# Patient Record
Sex: Female | Born: 1965 | Hispanic: No | Marital: Married | Smoking: Former smoker
Health system: Southern US, Community
[De-identification: ages and names within clinical notes are randomized; demographics above are authoritative.]

## PROBLEM LIST (undated history)

## (undated) DIAGNOSIS — R002 Palpitations: Secondary | ICD-10-CM

## (undated) DIAGNOSIS — I509 Heart failure, unspecified: Secondary | ICD-10-CM

## (undated) DIAGNOSIS — R569 Unspecified convulsions: Secondary | ICD-10-CM

## (undated) DIAGNOSIS — I10 Essential (primary) hypertension: Secondary | ICD-10-CM

## (undated) DIAGNOSIS — R079 Chest pain, unspecified: Secondary | ICD-10-CM

## (undated) DIAGNOSIS — R55 Syncope and collapse: Secondary | ICD-10-CM

## (undated) DIAGNOSIS — E669 Obesity, unspecified: Secondary | ICD-10-CM

## (undated) DIAGNOSIS — M7989 Other specified soft tissue disorders: Secondary | ICD-10-CM

## (undated) HISTORY — DX: Unspecified convulsions: R56.9

## (undated) HISTORY — PX: COLONOSCOPY: SHX174

## (undated) HISTORY — DX: Other specified soft tissue disorders: M79.89

## (undated) HISTORY — DX: Chest pain, unspecified: R07.9

## (undated) HISTORY — DX: Obesity, unspecified: E66.9

## (undated) HISTORY — DX: Palpitations: R00.2

## (undated) HISTORY — DX: Syncope and collapse: R55

---

## 2001-07-08 ENCOUNTER — Encounter: Admission: RE | Admit: 2001-07-08 | Discharge: 2001-07-08 | Payer: Self-pay

## 2011-01-07 HISTORY — PX: APPENDECTOMY: SHX54

## 2013-10-19 ENCOUNTER — Other Ambulatory Visit: Payer: Self-pay | Admitting: Nurse Practitioner

## 2013-10-23 NOTE — Telephone Encounter (Signed)
I called patient at her home and work numbers. Her home number is D/C'd. She does not work at the location associated with her work number. I will send her a letter at address on file.

## 2013-11-06 ENCOUNTER — Telehealth: Payer: Self-pay | Admitting: Nurse Practitioner

## 2013-11-06 NOTE — Telephone Encounter (Signed)
LMVM for pt on work # that appt made for her on Friday 11-10-13 (first available) at 0900 be here 0845.

## 2013-11-07 ENCOUNTER — Encounter: Payer: Self-pay | Admitting: Nurse Practitioner

## 2013-11-10 ENCOUNTER — Ambulatory Visit (INDEPENDENT_AMBULATORY_CARE_PROVIDER_SITE_OTHER): Payer: BC Managed Care – PPO | Admitting: Nurse Practitioner

## 2013-11-10 ENCOUNTER — Encounter (INDEPENDENT_AMBULATORY_CARE_PROVIDER_SITE_OTHER): Payer: Self-pay

## 2013-11-10 ENCOUNTER — Encounter: Payer: Self-pay | Admitting: Nurse Practitioner

## 2013-11-10 VITALS — BP 127/82 | HR 81 | Ht 66.0 in | Wt 246.0 lb

## 2013-11-10 DIAGNOSIS — Z79899 Other long term (current) drug therapy: Secondary | ICD-10-CM

## 2013-11-10 DIAGNOSIS — R569 Unspecified convulsions: Secondary | ICD-10-CM | POA: Insufficient documentation

## 2013-11-10 NOTE — Patient Instructions (Signed)
Continue Keppra at current dose Will get labs today Will schedule EEG F/U in 3 months

## 2013-11-10 NOTE — Progress Notes (Signed)
I agree with the assessment and plan as directed by NP .The patient is known to me .   Alithia Zavaleta, MD  

## 2013-11-10 NOTE — Progress Notes (Signed)
[facility]  PATIENT: Julie Hampton DOB: 02-16-1966   REASON FOR VISIT: followup for seizure disorder     HISTORY OF PRESENT ILLNESS: Julie Hampton, 47 year old right handed female returns for followup. She has a history of seizure disorder for greater than 30 years was last seen in our office 07/08/2012. At that time she was on Keppra 500 (4) tabs daily with no seizure activity in several years. She has been on Lyrica,  Carbamazepine, and Topamax in the past with side effects. She reports today on her followup visit she had several episodes in the past month where she had dizziness, tingling in the face and hands on both sides, confusion and inability to focus  and palpitations. She claims that she had a stress test back in the summer at [facility] which was normal. She says her seizures have started like this in the past. She has not had recent labs.   REVIEW OF SYSTEMS: Full 14 system review of systems performed and notable only for: those listed all others negative Constitutional: N/A  Cardiovascular: N/A  Ear/Nose/Throat: N/A  Skin: N/A  Eyes: N/A  Respiratory: N/A  Gastroitestinal: N/A  Hematology/Lymphatic: N/A  Endocrine: N/A Musculoskeletal:N/A  Allergy/Immunology: N/A  Neurological: Numbness, dizziness  Psychiatric: N/A   ALLERGIES: Allergies  Allergen Reactions  . Penicillins     HOME MEDICATIONS: Outpatient Prescriptions Prior to Visit  Medication Sig Dispense Refill  . levETIRAcetam (KEPPRA XR) 500 MG 24 hr tablet TAKE 4 TABLETS BY MOUTH EVERY MORNING.  120 tablet  1   No facility-administered medications prior to visit.    PAST MEDICAL HISTORY: Past Medical History  Diagnosis Date  . Seizures     PAST SURGICAL HISTORY: Past Surgical History  Procedure Laterality Date  . Appendectomy  01/2011  . Colonoscopy      removed pre-cancerous polyps    FAMILY HISTORY: Family History  Problem Relation Age of Onset  . Colon cancer Mother    . Heart failure Father   . Breast cancer      Aunt  . Epilepsy Sister   . Skin cancer Sister     SOCIAL HISTORY: History   Social History  . Marital Status: Married    Spouse Name: N/A    Number of Children: N/A  . Years of Education: N/A   Occupational History  . Not on file.   Social History Main Topics  . Smoking status: Former Games developer  . Smokeless tobacco: Never Used     Comment: Quit- 1995  . Alcohol Use: No     Comment: Quit in 1989  . Drug Use: No  . Sexual Activity: Not on file   Other Topics Concern  . Not on file   Social History Narrative   Patient is married and lives with her husband and son.   Patient is a Runner, broadcasting/film/video in [place].   Patient is right-handed.   Patient drinks one cup of coffee daily.     PHYSICAL EXAM  Filed Vitals:   11/10/13 0859  BP: 127/82  Pulse: 81  Height: 5\' 6"  (1.676 m)  Weight: 246 lb (111.585 kg)   Body mass index is 39.72 kg/(m^2).  Generalized: Well developed, obese female in no acute distress  Head: normocephalic and atraumatic,. Oropharynx benign  Neck: Supple, no carotid bruits  Cardiac: Regular rate rhythm, no murmur  Musculoskeletal: Brace to  right hand, fractured  wrist Neurological examination   Mentation: Alert oriented to time, place, history taking.  Follows all commands speech and language fluent  Cranial nerve II-XII: Fundoscopic exam reveals sharp disc margins.Pupils were equal round reactive to light extraocular movements were full, visual field were full on confrontational test. Facial sensation and strength were normal. hearing was intact to finger rubbing bilaterally. Uvula tongue midline. head turning and shoulder shrug were normal and symmetric.Tongue protrusion into cheek strength was normal. Motor: normal bulk and tone, full strength in the BUE, BLE, fine finger movements normal, no pronator drift. No focal weakness Sensory: normal and symmetric to light touch, pinprick, and  vibration    Coordination: finger-nose-finger, heel-to-shin bilaterally, no dysmetria Reflexes: 1+ upper lower and symmetric Gait and Station: Rising up from seated position without assistance, normal stance,  moderate stride, good arm swing, smooth turning, able to perform tiptoe, and heel walking without difficulty. Tandem gait is steady  DIAGNOSTIC DATA (LABS, IMAGING, TESTING) - None to review    ASSESSMENT AND PLAN  47 y.o. year old female  has a past medical history of Seizures. here to followup. She has had 3 episodes recently where she had dizziness, tingling in the face , numbness of both hands, confusion and palpitations which she says is like her previous seizure activity. She denies missing any doses of her Keppra  Continue Keppra at current dose Will get labs today, CBC, CMP and keppra level Will schedule EEG F/U in 3 months Nilda Riggs, [facility], [facility], APRN  [facility] [address], [address] [place], [place] [address] [phone]

## 2013-11-12 ENCOUNTER — Other Ambulatory Visit: Payer: Self-pay

## 2013-11-12 MED ORDER — LEVETIRACETAM ER 500 MG PO TB24: 2000.0000 mg | ORAL_TABLET | ORAL | Status: DC

## 2013-11-13 LAB — CBC WITH DIFFERENTIAL/PLATELET
Basophils Absolute: 0 10*3/uL (ref 0.0–0.2)
Basos: 1 %
Eos: 4 %
Eosinophils Absolute: 0.2 10*3/uL (ref 0.0–0.4)
HCT: 40.2 % (ref 34.0–46.6)
Hemoglobin: 13.8 g/dL (ref 11.1–15.9)
Immature Grans (Abs): 0 10*3/uL (ref 0.0–0.1)
Immature Granulocytes: 0 %
Lymphocytes Absolute: 1.8 10*3/uL (ref 0.7–3.1)
Lymphs: 28 %
MCH: 31.4 pg (ref 26.6–33.0)
MCHC: 34.3 g/dL (ref 31.5–35.7)
MCV: 92 fL (ref 79–97)
Monocytes Absolute: 0.5 10*3/uL (ref 0.1–0.9)
Monocytes: 8 %
Neutrophils Absolute: 3.8 10*3/uL (ref 1.4–7.0)
Neutrophils Relative %: 59 %
RBC: 4.39 x10E6/uL (ref 3.77–5.28)
RDW: 13.1 % (ref 12.3–15.4)
WBC: 6.4 10*3/uL (ref 3.4–10.8)

## 2013-11-13 LAB — COMPREHENSIVE METABOLIC PANEL
ALT: 14 IU/L (ref 0–32)
AST: 14 IU/L (ref 0–40)
Albumin/Globulin Ratio: 2 (ref 1.1–2.5)
Albumin: 4.5 g/dL (ref 3.5–5.5)
Alkaline Phosphatase: 60 IU/L (ref 39–117)
BUN/Creatinine Ratio: 14 (ref 9–23)
BUN: 11 mg/dL (ref 6–24)
CO2: 22 mmol/L (ref 18–29)
Calcium: 10 mg/dL (ref 8.7–10.2)
Chloride: 101 mmol/L (ref 97–108)
Creatinine, Ser: 0.78 mg/dL (ref 0.57–1.00)
GFR calc Af Amer: 106 mL/min/{1.73_m2} (ref >59)
GFR calc non Af Amer: 92 mL/min/{1.73_m2} (ref >59)
Globulin, Total: 2.2 g/dL (ref 1.5–4.5)
Glucose: 120 mg/dL — ABNORMAL HIGH (ref 65–99)
Potassium: 4.1 mmol/L (ref 3.5–5.2)
Sodium: 143 mmol/L (ref 134–144)
Total Bilirubin: 0.3 mg/dL (ref 0.0–1.2)
Total Protein: 6.7 g/dL (ref 6.0–8.5)

## 2013-11-13 LAB — LEVETIRACETAM LEVEL: Levetiracetam Lvl: 27.3 ug/mL (ref 5.0–63.0)

## 2013-11-14 ENCOUNTER — Other Ambulatory Visit: Payer: Self-pay | Admitting: Nurse Practitioner

## 2013-11-14 MED ORDER — LEVETIRACETAM ER 500 MG PO TB24: ORAL_TABLET | ORAL | Status: DC

## 2013-11-15 ENCOUNTER — Other Ambulatory Visit (INDEPENDENT_AMBULATORY_CARE_PROVIDER_SITE_OTHER): Payer: BC Managed Care – PPO | Admitting: Radiology

## 2013-11-15 DIAGNOSIS — R569 Unspecified convulsions: Secondary | ICD-10-CM

## 2013-11-15 NOTE — Progress Notes (Signed)
Quick Note:  Left message on a number that was in centricity [phone] because when I called the number in EPIC it was busy as it was for [place] 2 times previously. I asked her to increase her Keppra by 500mg  daily and also to return my call to be sure I have the correct number. ______

## 2013-11-28 ENCOUNTER — Telehealth: Payer: Self-pay | Admitting: *Deleted

## 2013-11-28 NOTE — Telephone Encounter (Signed)
This Rx was already sent in on 12/09.  I called the pharmacy, they said they do have the Rx, but the patient never picked it up, so it was returned to stock.  I called the patient several times before I was able to reach her.  Finally spoke with the patient and advised Rx was already sent.  She will follow up with the pharmacy.

## 2013-12-13 ENCOUNTER — Telehealth: Payer: Self-pay | Admitting: Nurse Practitioner

## 2013-12-13 NOTE — Progress Notes (Signed)
[facility]  EEG (ELECTROENCEPHALOGRAM) REPORT   STUDY DATE:  11-25-13  PATIENT NAME: Julie Hampton, Julie Hampton  DOB: 1966/01/04  MRN:   ORDERING CLINICIAN: Burnell BlanksMaura Hamrick, MD  / Melvyn Novasarmen Lesbia Ottaway, MD   TECHNOLOGIST: Kaylyn LimFox, Sue  TECHNIQUE: Electroencephalogram was recorded utilizing standard 10-20 system of lead placement and reformatted into average and bipolar montages.   RECORDING TIME: 30.5 minutes  ACTIVATION:  HV and strobe lights.   CLINICAL INFORMATION:  FINDINGS:   This EEG documented a 9 hertz posterior dominant rhythm, a symmetric , well organized.  No epileptiform activity was seen, focality, nor EEG slowing nor spike/ wave discharges.  Sleep and wake activity was seen, reflected in normal physiologic responses in this  EEG .  There was photic entrainment at 5,7,9 hertz strobe light stimulation and amplitude built up with HV maneuver,  none resulted in epileptiform activity.  The EKG documented  normal sinus rhythm , 66 bpm.      IMPRESSION: This EEG is normal.   Melvyn Novasarmen Allean Montfort , MD  Cc Darrol Angelarolyn Martin , NP

## 2013-12-15 NOTE — Telephone Encounter (Signed)
error 

## 2014-02-08 ENCOUNTER — Ambulatory Visit: Payer: BC Managed Care – PPO | Admitting: Nurse Practitioner

## 2014-02-08 ENCOUNTER — Telehealth: Payer: Self-pay | Admitting: Nurse Practitioner

## 2014-02-08 NOTE — Telephone Encounter (Signed)
No show for scheduled appt 

## 2014-04-15 ENCOUNTER — Other Ambulatory Visit: Payer: Self-pay | Admitting: Nurse Practitioner

## 2014-04-16 NOTE — Telephone Encounter (Signed)
No showed last appt  

## 2014-04-23 ENCOUNTER — Telehealth: Payer: Self-pay | Admitting: Nurse Practitioner

## 2014-04-23 MED ORDER — LEVETIRACETAM ER 500 MG PO TB24: ORAL_TABLET | ORAL | Status: DC

## 2014-04-23 NOTE — Telephone Encounter (Signed)
Rx has been sent to last until appt in Sept.  Called patient back, got no answer.  Pharmacy to call pt when Rx is ready for pick up.

## 2014-04-23 NOTE — Telephone Encounter (Signed)
Patient requesting refill of levETIRAcetam (KEPPRA XR) 500 MG 24 hr tablet.  Pt takes 5 tabs a day.  Please call and advise.

## 2014-09-05 ENCOUNTER — Telehealth: Payer: Self-pay | Admitting: Nurse Practitioner

## 2014-09-05 ENCOUNTER — Ambulatory Visit: Payer: BC Managed Care – PPO | Admitting: Nurse Practitioner

## 2014-09-05 NOTE — Telephone Encounter (Signed)
No show for scheduled appt 

## 2014-10-01 ENCOUNTER — Other Ambulatory Visit: Payer: Self-pay | Admitting: Neurology

## 2014-10-04 ENCOUNTER — Telehealth: Payer: Self-pay | Admitting: Neurology

## 2014-10-08 ENCOUNTER — Other Ambulatory Visit: Payer: Self-pay

## 2014-10-08 MED ORDER — LEVETIRACETAM ER 500 MG PO TB24: ORAL_TABLET | ORAL | Status: DC

## 2014-10-08 NOTE — Telephone Encounter (Signed)
Patient called to make an appointment, scheduled for 10/22/14 with Dr. Vickey [facility]ohmeier [facility] to [facility].

## 2014-10-08 NOTE — Telephone Encounter (Signed)
Patient has appt scheduled

## 2014-10-22 ENCOUNTER — Encounter: Payer: Self-pay | Admitting: Neurology

## 2014-10-22 ENCOUNTER — Telehealth: Payer: Self-pay | Admitting: Radiology

## 2014-10-22 ENCOUNTER — Ambulatory Visit (INDEPENDENT_AMBULATORY_CARE_PROVIDER_SITE_OTHER): Payer: BC Managed Care – PPO | Admitting: Neurology

## 2014-10-22 VITALS — BP 135/92 | HR 76 | Temp 98.1°F | Resp 14 | Ht 66.0 in | Wt 229.0 lb

## 2014-10-22 DIAGNOSIS — G40909 Epilepsy, unspecified, not intractable, without status epilepticus: Secondary | ICD-10-CM

## 2014-10-22 DIAGNOSIS — Z5181 Encounter for therapeutic drug level monitoring: Secondary | ICD-10-CM

## 2014-10-22 MED ORDER — LEVETIRACETAM ER 500 MG PO TB24
ORAL_TABLET | ORAL | Status: DC
Start: 2014-10-22 — End: 2015-03-03

## 2014-10-22 NOTE — Patient Instructions (Signed)
Epilepsy °People with epilepsy have times when they shake and jerk uncontrollably (seizures). This happens when there is a sudden change in brain function. Epilepsy may have many possible causes. Anything that disturbs the normal pattern of brain cell activity can lead to seizures. °HOME CARE  °· Follow your doctor's instructions about driving and safety during normal activities. °· Get enough sleep. °· Only take medicine as told by your doctor. °· Avoid things that you know can cause you to have seizures (triggers). °· Write down when your seizures happen and what you remember about each seizure. Write down anything you think may have caused the seizure to happen. °· Tell the people you live and work with that you have seizures. Make sure they know how to help you. They should: °¨ Cushion your head and body. °¨ Turn you on your side. °¨ Not restrain you. °¨ Not place anything inside your mouth. °¨ Call for local emergency medical help if there is any question about what has happened. °· Keep all follow-up visits with your doctor. This is very important. °GET HELP IF: °· You get an infection or start to feel sick. You may have more seizures when you are sick. °· You are having seizures more often. °· Your seizure pattern is changing. °GET HELP RIGHT AWAY IF:  °· A seizure does not stop after a few seconds or minutes. °· A seizure causes you to have trouble breathing. °· A seizure gives you a very bad headache. °· A seizure makes you unable to speak or use a part of your body. °Document Released: 09/20/2009 Document Revised: 09/13/2013 Document Reviewed: 07/05/2013 °ExitCare® Patient Information ©2015 [employer]. This information is not intended to replace advice given to you by your health care provider. Make sure you discuss any questions you have with your health care provider. ° °

## 2014-10-22 NOTE — Progress Notes (Signed)
[facility]  PATIENT: Julie Hampton DOB: 12-13-1965   REASON FOR VISIT: followup for seizure disorder     HISTORY OF PRESENT ILLNESS: Ms. Julie Hampton, 48 year old right handed, married female returns for followup.  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  She has a history of seizure disorder for greater than 30 years, changed to [facility] in 2006 and was seen by Dr Nash Shearerhampey.  She was treated on multiple medications, polypharmacy and I [facility], she returned on only one drug, KEPPRA, and has been controlled ever since.   Was last seen in our office 11/09/2013 with Darrol Angelarolyn Martin, NP .  At that time she was on Keppra 500 (4) tabs daily with no seizure activity in several years. She has been on Lyrica,  Carbamazepine, and Topamax in the past with side effects.   REVIEW OF SYSTEMS: Full 14 system review of systems performed and notable only for: those listed all others negative Constitutional: N/A  Cardiovascular: N/A  Ear/Nose/Throat: N/A  Skin: N/A  Eyes: N/A  Respiratory: N/A  Gastroitestinal: N/A  Hematology/Lymphatic: N/A  Endocrine: N/A Musculoskeletal:N/A  Allergy/Immunology: N/A  Neurological: Numbness, dizziness  Psychiatric: N/A   ALLERGIES: Allergies  Allergen Reactions  . Morphine And Related   . Other     Banana peppers  . Penicillins   . Thallium Hives    Contrast dye for cardiac stress test.  Had increased heart rate, hives.    HOME MEDICATIONS: Outpatient Prescriptions Prior to Visit  Medication Sig Dispense Refill  . levETIRAcetam (KEPPRA XR) 500 MG 24 hr tablet TAKE 2 TABS BY MOUTH IN THE MORNING AND 3 TABS IN THE EVENING (12 HOURS APART) 150 tablet 0   No facility-administered medications prior to visit.    PAST MEDICAL HISTORY: Past Medical History  Diagnosis Date  . Seizures     PAST SURGICAL HISTORY: Past Surgical History  Procedure Laterality Date  . Appendectomy   01/2011  . Colonoscopy      removed pre-cancerous polyps    FAMILY HISTORY: Family History  Problem Relation Age of Onset  . Colon cancer Mother   . Heart failure Father   . Breast cancer      Aunt  . Epilepsy Sister   . Skin cancer Sister     SOCIAL HISTORY: History   Social History  . Marital Status: Married    Spouse Name: N/A    Number of Children: N/A  . Years of Education: N/A   Occupational History  . Not on file.   Social History Main Topics  . Smoking status: Former Games developermoker  . Smokeless tobacco: Never Used     Comment: Quit- 1995  . Alcohol Use: No     Comment: Quit in 1989  . Drug Use: No  . Sexual Activity: Not on file   Other Topics Concern  . Not on file   Social History Narrative   Patient is married and lives with her husband and son.   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   Patient drinks one cup of coffee daily.     PHYSICAL EXAM  Filed Vitals:   10/22/14 1515  BP: 135/92  Pulse: 76  Temp: 98.1 F (36.7 C)  TempSrc: Oral  Resp: 14  Height: 5\' 6"  (1.676 m)  Weight: 229 lb (103.874 kg)   Body mass index is 36.98 kg/(m^2).  Generalized: Well developed, obese female in no acute distress  Head: normocephalic and atraumatic,. Oropharynx benign  Neck: Supple, no carotid bruits  Cardiac: Regular rate rhythm, no murmur  Musculoskeletal: Brace to right hand, fractured wrist in 2014 - still in a cast after a torn ligament in October . Neurological examination   Mentation: Alert oriented to time, place, history taking. Follows all commands speech and language fluent, is cooperative.   Cranial nerve: Fundoscopic exam reveals sharp disc margins.Pupils were equal round reactive to light , fundus is normal, extraocular movements were full, visual field were full on confrontational test.  Facial sensation and strength were normal. hearing was intact to finger rubbing bilaterally.  Uvula tongue midline. head turning  and shoulder shrug were normal and symmetric.Tongue protrusion into cheek strength was normal. Motor: normal bulk and tone, full strength - fine finger movements normal, no pronator drift.  No focal weakness Sensory: normal and symmetric to light touch, pinprick, and  vibration  Coordination: finger-nose-finger- no dysmetria Reflexes: 1+ upper lower and symmetric.  Gait and Station: Rising up from seated position without assistance, normal stance,  moderate stride, good arm swing,  smooth turning, able to perform tiptoe, and heel walking without difficulty.   DIAGNOSTIC DATA (LABS, IMAGING, TESTING) 12- 04 -2015 ,  Normal CBC and diff. Keppra level and metabolic panel. Reviewed with the patient.    ASSESSMENT AND PLAN  48 y.o. year old female  has a past medical history of Seizures. here to followup.  She has had 1 episode, 3 weeks ago in late October ,  where she had dizziness, tingling in the face , numbness of both hands, confusion and palpitations , which she says is like her previous seizure activity.  She denies missing any doses of her Keppra.  She is doing well. Refills signed and lab work repeated.  Melvyn Novasarmen Erin Obando, MD  [facility] [address], [address] [place], [place] [phone]

## 2014-10-23 LAB — COMPREHENSIVE METABOLIC PANEL
ALBUMIN: 4.8 g/dL (ref 3.5–5.5)
ALT: 18 IU/L (ref 0–32)
AST: 21 IU/L (ref 0–40)
Albumin/Globulin Ratio: 2.5 (ref 1.1–2.5)
Alkaline Phosphatase: 56 IU/L (ref 39–117)
BILIRUBIN TOTAL: 0.3 mg/dL (ref 0.0–1.2)
BUN / CREAT RATIO: 16 (ref 9–23)
BUN: 13 mg/dL (ref 6–24)
CO2: 23 mmol/L (ref 18–29)
CREATININE: 0.81 mg/dL (ref 0.57–1.00)
Calcium: 10.1 mg/dL (ref 8.7–10.2)
Chloride: 99 mmol/L (ref 97–108)
GFR calc non Af Amer: 87 mL/min/{1.73_m2} (ref >59)
GFR, EST AFRICAN AMERICAN: 101 mL/min/{1.73_m2} (ref >59)
GLOBULIN, TOTAL: 1.9 g/dL (ref 1.5–4.5)
Glucose: 80 mg/dL (ref 65–99)
Potassium: 4.8 mmol/L (ref 3.5–5.2)
Sodium: 140 mmol/L (ref 134–144)
Total Protein: 6.7 g/dL (ref 6.0–8.5)

## 2014-11-04 ENCOUNTER — Other Ambulatory Visit: Payer: Self-pay | Admitting: Neurology

## 2014-11-06 ENCOUNTER — Encounter: Payer: Self-pay | Admitting: *Deleted

## 2015-03-03 ENCOUNTER — Other Ambulatory Visit: Payer: Self-pay | Admitting: Neurology

## 2015-09-30 ENCOUNTER — Other Ambulatory Visit: Payer: Self-pay | Admitting: Neurology

## 2015-10-23 ENCOUNTER — Ambulatory Visit: Payer: BC Managed Care – PPO | Admitting: Nurse Practitioner

## 2015-10-24 NOTE — Telephone Encounter (Signed)
This encounter was created in error - please disregard.

## 2015-10-28 ENCOUNTER — Other Ambulatory Visit: Payer: Self-pay | Admitting: Neurology

## 2015-10-29 ENCOUNTER — Telehealth: Payer: Self-pay

## 2015-10-29 ENCOUNTER — Other Ambulatory Visit: Payer: Self-pay | Admitting: Neurology

## 2015-10-29 MED ORDER — LEVETIRACETAM ER 500 MG PO TB24: ORAL_TABLET | ORAL | Status: DC

## 2015-10-29 NOTE — Telephone Encounter (Signed)
I was advised that pt must have 3 no shows in a years time to be dismissed. She had 2 in 2015 and 1 in 2016 thus far.

## 2015-10-29 NOTE — Telephone Encounter (Signed)
No refill if no revisit with in 12 months. After patient had 3 no-shows she will be dismissed, I will give her 30 days of medication and she can find a new neurologist/  [employer]en Philbert Hampton.

## 2015-10-29 NOTE — Telephone Encounter (Signed)
Pharmacy is requesting refill on Keppra.  It appears the patient no showed appt 10/23/15 (has not rescheduled), and had previous no shows on 02/08/2014 and 09/05/2014.  She did, however, come in for OV last year in Nov 2015.  Would you like to refill at this time?  Please advise.  Thank you.

## 2015-10-30 NOTE — Telephone Encounter (Signed)
Attempted to call pt to schedule an appt with her. Both numbers listed rang busy 2 times. Unable to leave messages. If pt calls, she needs to be made a 30 minute office visit with Dr. Vickey Hugerohmeier.

## 2015-12-12 ENCOUNTER — Other Ambulatory Visit: Payer: Self-pay | Admitting: Neurology

## 2015-12-13 ENCOUNTER — Other Ambulatory Visit: Payer: Self-pay

## 2015-12-16 ENCOUNTER — Other Ambulatory Visit: Payer: Self-pay | Admitting: Neurology

## 2015-12-17 ENCOUNTER — Other Ambulatory Visit: Payer: Self-pay

## 2015-12-17 ENCOUNTER — Telehealth: Payer: Self-pay | Admitting: Neurology

## 2015-12-17 NOTE — Telephone Encounter (Signed)
Per previous encounter [name] has been trying to reach this patient regarding appt.  I called back on primary line, got no answer.  Called alt number, got no answer.  Left message.

## 2015-12-17 NOTE — Telephone Encounter (Signed)
Pt needs refill on levETIRAcetam (KEPPRA XR) 500 MG 24 hr tablet. Thank you

## 2015-12-18 ENCOUNTER — Other Ambulatory Visit: Payer: Self-pay

## 2015-12-18 MED ORDER — LEVETIRACETAM ER 500 MG PO TB24: ORAL_TABLET | ORAL | Status: DC

## 2015-12-18 NOTE — Telephone Encounter (Signed)
Pt called and has appt for 01/27/16.

## 2015-12-18 NOTE — Telephone Encounter (Signed)
Request has been forwarded to provider for review.  

## 2015-12-18 NOTE — Telephone Encounter (Signed)
Patient has rescheduled missed appt.  

## 2016-01-13 ENCOUNTER — Other Ambulatory Visit: Payer: Self-pay | Admitting: Neurology

## 2016-01-27 ENCOUNTER — Ambulatory Visit: Payer: BC Managed Care – PPO | Admitting: Neurology

## 2016-01-27 ENCOUNTER — Telehealth: Payer: Self-pay

## 2016-01-27 NOTE — Telephone Encounter (Signed)
Pt did not show for their appt with Dr. Dohmeier today.  

## 2016-02-13 ENCOUNTER — Other Ambulatory Visit: Payer: Self-pay | Admitting: Neurology

## 2016-02-17 NOTE — Telephone Encounter (Signed)
Pt has no-showed her past 2 appts. Pt must be seen before she can get any more refills.

## 2016-03-16 ENCOUNTER — Telehealth: Payer: Self-pay | Admitting: Neurology

## 2016-03-16 ENCOUNTER — Other Ambulatory Visit: Payer: Self-pay | Admitting: Neurology

## 2016-03-16 NOTE — Telephone Encounter (Signed)
Received a refill request for pt's keppra. Pt has no showed past several appts, but per [facility] policy, we can only dismiss her if she has had 3 no-shows within the past one year. No showed: 01/27/16, 10/23/15, 09/05/14, 02/08/14.  I called pt. Home number was not available. I left a message at pt's work number to call me back.

## 2016-03-16 NOTE — Telephone Encounter (Signed)
Patient called to get her Keppra filled and was told she would have to come in for an appointment.  While speaking with her and attempting to give her a date I lost the call.

## 2016-03-16 NOTE — Telephone Encounter (Signed)
Pt called to make an appt. Looking at her chart she has had several no shows over time. She asked to be rescheduled while on the phone. I asked if she could pay her r/s fee of $35 she said she wanted to pay when she came in for appt. I told her we would need her to pay the fee first. Pt said her wallet was in her car and she couldn't go get it. I told her that it was ok, we closed at 5p and reopened at 8 am. She then hung up on me.

## 2016-03-17 NOTE — Telephone Encounter (Signed)
I spoke to pt and advised her that she must make an appt and pay the no-show fee in order to continue getting med refills on keppra. She has already had 2 no shows within the past year. She made an appt for 04/06/16 at 9:30. I stressed the importance of coming to this appt. Pt verbalized understanding.

## 2016-04-06 ENCOUNTER — Telehealth: Payer: Self-pay | Admitting: Neurology

## 2016-04-06 ENCOUNTER — Encounter: Payer: Self-pay | Admitting: Neurology

## 2016-04-06 ENCOUNTER — Ambulatory Visit (INDEPENDENT_AMBULATORY_CARE_PROVIDER_SITE_OTHER): Payer: BC Managed Care – PPO | Admitting: Neurology

## 2016-04-06 VITALS — BP 110/86 | HR 76 | Resp 20 | Ht 66.0 in | Wt 242.0 lb

## 2016-04-06 DIAGNOSIS — G40209 Localization-related (focal) (partial) symptomatic epilepsy and epileptic syndromes with complex partial seizures, not intractable, without status epilepticus: Secondary | ICD-10-CM | POA: Diagnosis not present

## 2016-04-06 NOTE — Telephone Encounter (Signed)
Dr. Vickey Hugerohmeier cleared pt for arthroscopy and instructed pt to take keppra at midnight with clear water. Form faxed back to [facility].
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
## 2016-04-06 NOTE — Patient Instructions (Signed)
Levetiracetam extended-release tablets °What is this medicine? °LEVETIRACETAM (lee ve tye RA se tam) is an antiepileptic drug. It is used with other medicines to treat certain types of seizures. °This medicine may be used for other purposes; ask your health care provider or pharmacist if you have questions. °What should I tell my health care provider before I take this medicine? °They need to know if you have any of these conditions: °-kidney disease °-suicidal thoughts, plans, or attempt; a previous suicide attempt by you or a family member °-an unusual or allergic reaction to levetiracetam, other medicines, foods, dyes, or preservatives °-pregnant or trying to get pregnant °-breast-feeding °How should I use this medicine? °Take this medicine by mouth with a glass of water. Follow the directions on the prescription label. Do not cut, crush or chew this medicine. You may take this medicine with or without food. Take your doses at regular intervals. Do not take your medicine more often than directed. Do not stop taking this medicine or any of your seizure medicines unless instructed by your doctor or health care professional. Stopping your medicine suddenly can increase your seizures or their severity. °A special MedGuide will be given to you by the pharmacist with each prescription and refill. Be sure to read this information carefully each time. °Contact your pediatrician or health care professional regarding the use of this medication in children. While this drug may be prescribed for children as young as 12 years of age for selected conditions, precautions do apply. °Overdosage: If you think you have taken too much of this medicine contact a poison control center or emergency room at once. °NOTE: This medicine is only for you. Do not share this medicine with others. °What if I miss a dose? °If you miss a dose and it has only been a few hours, take it as soon as you can. If it is almost time for your next dose,  take only that dose. Do not take double or extra doses. °What may interact with this medicine? °This medicine may interact with the following medications: °-carbamazepine °-colesevelam °-probenecid °-sevelamer °This list may not describe all possible interactions. Give your health care provider a list of all the medicines, herbs, non-prescription drugs, or dietary supplements you use. Also tell them if you smoke, drink alcohol, or use illegal drugs. Some items may interact with your medicine. °What should I watch for while using this medicine? °Visit your doctor or health care professional for a regular check on your progress. Wear a medical identification bracelet or chain to say you have epilepsy, and carry a card that lists all your medications. °It is important to take this medicine exactly as instructed by your health care professional. When first starting treatment, your dose may need to be adjusted. It may take weeks or months before your dose is stable. You should contact your doctor or health care professional if your seizures get worse or if you have any new types of seizures. °You may get drowsy or dizzy. Do not drive, use machinery, or do anything that needs mental alertness until you know how this medicine affects you. Do not stand or sit up quickly, especially if you are an older patient. This reduces the risk of dizzy or fainting spells. Alcohol may interfere with the effect of this medicine. Avoid alcoholic drinks. °The use of this medicine may increase the chance of suicidal thoughts or actions. Pay special attention to how you are responding while on this medicine. Any worsening of mood,   or thoughts of suicide or dying should be reported to your health care professional right away. °The tablet shell for some brands of this medicine does not dissolve. This is normal. The tablet shell may appear in the stool. This is not cause for concern. °Women who become pregnant while using this medicine may  enroll in the [place] American Antiepileptic Drug Pregnancy Registry by calling 1-888-233-2334. This registry collects information about the safety of antiepileptic drug use during pregnancy. °What side effects may I notice from receiving this medicine? °Side effects you should report to your doctor or health care professional as soon as possible: °-allergic reactions like skin rash, itching or hives, swelling of the face, lips, or tongue °-breathing problems °-changes in emotions or moods °-dark urine °-general ill feeling or flu-like symptoms °-problems with balance, talking, walking °-suicidal thoughts or actions °-unusually weak or tired °-yellowing of the eyes or skin °Side effects that usually do not require medical attention (report to your doctor or health care professional if they continue or are bothersome): °-diarrhea °-dizzy, drowsy °-headache °-loss of appetite °This list may not describe all possible side effects. Call your doctor for medical advice about side effects. You may report side effects to FDA at 1-800-FDA-1088. °Where should I keep my medicine? °Keep out of reach of children. °Store at room temperature between 15 and 30 degrees C (59 and 86 degrees F). Throw away any unused medicine after the expiration date. °NOTE: This sheet is a summary. It may not cover all possible information. If you have questions about this medicine, talk to your doctor, pharmacist, or health care provider. °  °© 2016, Elsevier/Gold Standard. (2015-03-18 10:13:38) ° °

## 2016-04-06 NOTE — Telephone Encounter (Signed)
Shanda BumpsJessica with [facility] is calling to let you know she is sending over a clearance form for authorization for surgery. Please fax to [phone].

## 2016-04-07 DIAGNOSIS — Z0289 Encounter for other administrative examinations: Secondary | ICD-10-CM

## 2016-04-07 LAB — CBC WITH DIFFERENTIAL/PLATELET
BASOS: 0 %
Basophils Absolute: 0 10*3/uL (ref 0.0–0.2)
EOS (ABSOLUTE): 0.2 10*3/uL (ref 0.0–0.4)
Eos: 3 %
HEMATOCRIT: 41.4 % (ref 34.0–46.6)
HEMOGLOBIN: 14.1 g/dL (ref 11.1–15.9)
IMMATURE GRANS (ABS): 0 10*3/uL (ref 0.0–0.1)
Immature Granulocytes: 0 %
Lymphocytes Absolute: 1.8 10*3/uL (ref 0.7–3.1)
Lymphs: 24 %
MCH: 31.3 pg (ref 26.6–33.0)
MCHC: 34.1 g/dL (ref 31.5–35.7)
MCV: 92 fL (ref 79–97)
MONOS ABS: 0.7 10*3/uL (ref 0.1–0.9)
Monocytes: 9 %
NEUTROS ABS: 4.7 10*3/uL (ref 1.4–7.0)
Neutrophils: 64 %
Platelets: 232 10*3/uL (ref 150–379)
RBC: 4.5 x10E6/uL (ref 3.77–5.28)
RDW: 12.9 % (ref 12.3–15.4)
WBC: 7.4 10*3/uL (ref 3.4–10.8)

## 2016-04-07 LAB — COMPREHENSIVE METABOLIC PANEL
A/G RATIO: 2 (ref 1.2–2.2)
ALBUMIN: 4.5 g/dL (ref 3.5–5.5)
ALT: 10 IU/L (ref 0–32)
AST: 13 IU/L (ref 0–40)
Alkaline Phosphatase: 58 IU/L (ref 39–117)
BUN/Creatinine Ratio: 15 (ref 9–23)
BUN: 11 mg/dL (ref 6–24)
Bilirubin Total: 0.3 mg/dL (ref 0.0–1.2)
CALCIUM: 10 mg/dL (ref 8.7–10.2)
CO2: 26 mmol/L (ref 18–29)
Chloride: 104 mmol/L (ref 96–106)
Creatinine, Ser: 0.72 mg/dL (ref 0.57–1.00)
GFR, EST AFRICAN AMERICAN: 115 mL/min/{1.73_m2} (ref >59)
GFR, EST NON AFRICAN AMERICAN: 100 mL/min/{1.73_m2} (ref >59)
GLOBULIN, TOTAL: 2.2 g/dL (ref 1.5–4.5)
Glucose: 83 mg/dL (ref 65–99)
POTASSIUM: 5.2 mmol/L (ref 3.5–5.2)
SODIUM: 142 mmol/L (ref 134–144)
TOTAL PROTEIN: 6.7 g/dL (ref 6.0–8.5)

## 2016-04-09 ENCOUNTER — Telehealth: Payer: Self-pay

## 2016-04-09 NOTE — Telephone Encounter (Signed)
-----   Message from Melvyn Novasarmen Dohmeier, MD sent at 04/08/2016  5:21 PM EDT ----- Normal labs !

## 2016-04-09 NOTE — Telephone Encounter (Signed)
I called pt to relay normal labs. I called home answer, but the number is no longer in service. I left a message at pt's work number to call me back.

## 2016-04-13 NOTE — Telephone Encounter (Signed)
I called pt again to discuss labs. Home number is not in service, and so I left a message at pt's work number asking her to call me back.

## 2016-04-14 NOTE — Telephone Encounter (Signed)
Attempted to call pt again, pt's home not is not available. Will send her a letter advising her that her labs are normal and ask her to call the office with questions.

## 2016-04-15 ENCOUNTER — Other Ambulatory Visit: Payer: Self-pay | Admitting: Neurology

## 2016-04-16 NOTE — Telephone Encounter (Signed)
I advised patient her labs were normal per below note.

## 2016-08-08 ENCOUNTER — Other Ambulatory Visit: Payer: Self-pay | Admitting: Neurology

## 2016-11-03 ENCOUNTER — Telehealth: Payer: Self-pay | Admitting: Neurology

## 2016-11-03 NOTE — Telephone Encounter (Signed)
Pt called to advise she is having [facility] 12/08/16. Dr Rogue Bussingellero is requesting a letter for clearance since she is epileptic. She advised she would call his office and fax a form to [phone]

## 2016-11-04 NOTE — Telephone Encounter (Signed)
Dr. Dohmeier completed the pre op form for pt, requesting that she "take <BADVickey HugerEXTTAG>1500mg  of keppra by mouth with water the day prior to surgery and 1000mg  of keppra IV in the morning of surgery"  Faxed pre op clearance, last office notes and recent labs by Dr. Vickey Hugerohmeier to [facility] at [phone]. Received a receipt of confirmation.

## 2016-12-08 HISTORY — PX: KNEE SURGERY: SHX244

## 2017-04-06 ENCOUNTER — Telehealth: Payer: Self-pay

## 2017-04-06 ENCOUNTER — Ambulatory Visit: Payer: BC Managed Care – PPO | Admitting: Neurology

## 2017-04-06 NOTE — Telephone Encounter (Signed)
Pt did not show for their appt with Dr. Dohmeier today.  

## 2017-04-07 ENCOUNTER — Encounter: Payer: Self-pay | Admitting: Neurology

## 2017-08-02 ENCOUNTER — Other Ambulatory Visit: Payer: Self-pay | Admitting: Neurology

## 2017-09-28 ENCOUNTER — Other Ambulatory Visit: Payer: Self-pay | Admitting: Neurology

## 2017-09-28 ENCOUNTER — Telehealth: Payer: Self-pay | Admitting: Nurse Practitioner

## 2017-09-28 MED ORDER — LEVETIRACETAM ER 500 MG PO TB24: ORAL_TABLET | ORAL | 0 refills | Status: DC

## 2017-09-28 NOTE — Telephone Encounter (Signed)
Gave 30 day refill. Has appt 10-01-17.

## 2017-09-28 NOTE — Addendum Note (Signed)
Addended by: Guy BeginYOUNG, SANDRA S on: 09/28/2017 03:32 PM   Modules accepted: Orders

## 2017-09-28 NOTE — Telephone Encounter (Signed)
Pt would like a refill of levETIRAcetam (KEPPRA XR) 500 MG 24 hr tablet Pt has med check appointment set for 10-01-2017 [facility]  [facility] [address] - [place], [place] - [address] [phone] (Phone) [phone] (Fax)

## 2017-09-30 NOTE — Progress Notes (Addendum)
[facility]  PATIENT: Julie Hampton DOB: 07/01/1966   REASON FOR VISIT: follow up for seizure disorder HISTORY FROM: Patient    HISTORY OF PRESENT ILLNESS UPDATE 10/01/2017 CMMr. Sturgill, 51 year old female returns for follow-up with a history of seizure disorder. She had her last seizure event in 2008. She is currently on Keppra extended release 500 mg 2 tablets in the morning and 3 at night. She had R knee surgery in January and is still having significant knee pain. [facility]. 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 She denies any falls. She ambulates with a single-point cane. She returns for reevaluation 04/10/16 CDMs. 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 She has been married 28 years and her adult son is working at [employer] as a Company secretaryfireman.  She has a history of seizure disorder for greater than 30 years, changed to GNA in 2006 and was seen by Dr Nash Shearerhampey.  She was treated on multiple medications, polypharmacy and I referred her [facility], she returned on only one drug, KEPPRA, and has been controlled ever since.  At that time she was on Keppra 500 (4) tabs daily with no seizure activity in several years.  She has been on Lyrica,  Carbamazepine, and Topamax in the past with side effects.  The patient's last seizure was in 2008.   REVIEW OF SYSTEMS: Full 14 system review of systems performed and notable only for those listed, all others are neg:  Constitutional: neg  Cardiovascular: neg Ear/Nose/Throat: neg  Skin: neg Eyes: neg Respiratory: neg Gastroitestinal: neg  Hematology/Lymphatic: neg  Endocrine: neg Musculoskeletal: Right knee pain Allergy/Immunology: neg Neurological: History of seizure disorder Psychiatric: neg Sleep :  neg   ALLERGIES: Allergies  Allergen Reactions  . Thallous Chloride Tl 201 Hives    Contrast dye for cardiac stress test.  Had increased heart rate, hives.  . Morphine And Related Other (See Comments)    L leg (paralysis). Lasted 18-24 hours.   L leg (paralysis). Lasted 18-24 hours.    . Other     Banana peppers  . Penicillins   . Beef Extract Rash  . Thallium Hives    Contrast dye for cardiac stress test.  Had increased heart rate, hives.    HOME MEDICATIONS: Outpatient Medications Prior to Visit  Medication Sig Dispense Refill  . EPINEPHrine (EPIPEN 2-PAK) 0.3 mg/0.3 mL IJ SOAJ injection Inject 0.3 mg into the muscle.    . levETIRAcetam (KEPPRA XR) 500 MG 24 hr tablet TAKE 2 TABLETS BY MOUTH IN THE MORNING, 3 TABS IN THE EVENING (ABOUT 12 HOURS APART) 150 tablet 0   No facility-administered medications prior to visit.     PAST MEDICAL HISTORY: Past Medical History:  Diagnosis Date  . Seizures (HCC)     PAST SURGICAL HISTORY: Past Surgical History:  Procedure Laterality Date  . APPENDECTOMY  01/2011  . COLONOSCOPY     removed pre-cancerous polyps  . KNEE SURGERY Right 12/08/2016    FAMILY HISTORY: Family History  Problem Relation Age of Onset  . Colon cancer Mother   . Heart failure Father   . Breast cancer Unknown        Aunt  . Epilepsy Sister   . Skin cancer  Sister     SOCIAL HISTORY: Social History   Social History  . Marital status: Married    Spouse name: N/A  . Number of children: N/A  . Years of education: N/A   Occupational History  . Not on file.   Social History Main Topics  . Smoking status: Former Games developer  . Smokeless tobacco: Never Used     Comment: Quit- 1995  . Alcohol use No     Comment: Quit in 1989  . Drug use: No  . Sexual activity: Not on file   Other Topics Concern  . Not on file   Social History Narrative   Patient is married and lives with her husband and son.   Patient is a Runner, broadcasting/film/video in [place].   Patient  is right-handed.   Patient drinks one cup of coffee daily.     PHYSICAL EXAM  Vitals:   10/01/17 1112  BP: 118/80  Pulse: 72  Weight: 218 lb 3.2 oz (99 kg)  Height: 5\' 6"  (1.676 m)   Body mass index is 35.22 kg/m.  Generalized: Well developed, obese female in no acute distress  Head: normocephalic and atraumatic,. Oropharynx benign  Neck: Supple,  Musculoskeletal: No deformity   Neurological examination   Mentation: Alert oriented to time, place, history taking. Attention span and concentration appropriate. Recent and remote memory intact.  Follows all commands speech and language fluent.   Cranial nerve II-XII: Pupils were equal round reactive to light extraocular movements were full, visual field were full on confrontational test. Facial sensation and strength were normal. hearing was intact to finger rubbing bilaterally. Uvula tongue midline. head turning and shoulder shrug were normal and symmetric.Tongue protrusion into cheek strength was normal. Motor: normal bulk and tone, full strength in the BUE, BLE, except right lower leg due to surgery and nonhealing Sensory: normal and symmetric to light touch, pinprick, and  Vibration, in the upper and lower extremities Coordination: finger-nose-finger, heel-to-shin bilaterally, no dysmetria Reflexes: Symmetric upper and lower plantar responses were flexor bilaterally. Gait and Station: Rising up from seated position without assistance, wide based  Stance  ambulates with a limp and single-point cane  DIAGNOSTIC DATA (LABS, IMAGING, TESTING) - I reviewed patient records, labs, notes, testing and imaging myself where available.  Lab Results  Component Value Date   WBC 7.4 04/06/2016   HGB 14.1 04/06/2016   HCT 41.4 04/06/2016   MCV 92 04/06/2016   PLT 232 04/06/2016      Component Value Date/Time   NA 142 04/06/2016 1005   K 5.2 04/06/2016 1005   CL 104 04/06/2016 1005   CO2 26 04/06/2016 1005   GLUCOSE 83 04/06/2016 1005    BUN 11 04/06/2016 1005   CREATININE 0.72 04/06/2016 1005   CALCIUM 10.0 04/06/2016 1005   PROT 6.7 04/06/2016 1005   ALBUMIN 4.5 04/06/2016 1005   AST 13 04/06/2016 1005   ALT 10 04/06/2016 1005   ALKPHOS 58 04/06/2016 1005   BILITOT 0.3 04/06/2016 1005   GFRNONAA 100 04/06/2016 1005   GFRAA 115 04/06/2016 1005    ASSESSMENT AND PLAN  51 y.o. year old female  has a past medical history of Seizures (HCC). here To follow-up for seizure disorder . The patient is a current patient of Dr. Vickey Huger  who is out of the office today . This note is sent to the work in doctor.     Continue Keppra current dose will refill for 1 year Call for any seizure activity  Follow-up yearly and when necessary Reviewed seizure triggers I spent 15 min  in total face to face time with the patient more than 50% of which was spent counseling and coordination of care, reviewing test results reviewing medications and discussing and reviewing the diagnosis of seizure disorder and most common seizure triggers such as pain, sleep deprivation, low blood sugar, etc. Nilda Riggs, [facility], [facility], APRN  [facility] [address], [address] [place], [place] [address] [phone]  I reviewed the above note and documentation by the Nurse Practitioner and agree with the history, physical exam, assessment and plan as outlined above. I was immediately available for face-to-face consultation. Huston Foley, MD, PhD [facility])

## 2017-10-01 ENCOUNTER — Ambulatory Visit (INDEPENDENT_AMBULATORY_CARE_PROVIDER_SITE_OTHER): Payer: BC Managed Care – PPO | Admitting: Nurse Practitioner

## 2017-10-01 ENCOUNTER — Encounter: Payer: Self-pay | Admitting: Nurse Practitioner

## 2017-10-01 DIAGNOSIS — G40909 Epilepsy, unspecified, not intractable, without status epilepticus: Secondary | ICD-10-CM | POA: Insufficient documentation

## 2017-10-01 MED ORDER — LEVETIRACETAM ER 500 MG PO TB24: ORAL_TABLET | ORAL | 11 refills | Status: DC

## 2017-10-01 NOTE — Patient Instructions (Signed)
Continue Keppra current dose will refill  Call for any seizure activity Follow-up yearly and when necessary 

## 2018-09-29 NOTE — Progress Notes (Signed)
[facility]  PATIENT: Julie Hampton DOB: 21-Dec-1965   REASON FOR VISIT: follow up for seizure disorder HISTORY FROM: Patient    HISTORY OF PRESENT ILLNESS UPDATE 10/28/2019CM Julie Hampton, 52 year old female returns for follow-up with history of seizure disorder.  Her last seizure occurred in 2008.  She is currently on Keppra twice daily release 500 mg 2 tabs in the morning and 3 tabs 12 hours later.  She denies side effects to the drug.  She has had an additional 2 knee surgery since last seen.  She is 50% weightbearing on the right.  She ambulates with crutches she will not go back to work until January.  She has not had any falls.  She returns for reevaluation   UPDATE 10/01/2017 CMMr. Hampton, 52 year old female returns for follow-up with a history of seizure disorder. She had her last seizure event in 2008. She is currently on Keppra extended release 500 mg 2 tablets in the morning and 3 at night. She had  knee surgery in January and is still having significant knee pain. She is going back to [facility] this afternoon repeat MRI. 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 She denies any falls. She ambulates with a single-point cane. She returns for reevaluation 04/10/16 CDMs. 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 She has been married 28 years and her adult son is working at [employer] as a Company secretary.  She has a history of seizure disorder for greater than 30 years, changed to GNA in 2006 and was seen by Dr Nash Shearer.  She was treated on multiple medications, polypharmacy and I referred her to EMU at [facility], she returned on only one drug, KEPPRA, and has been controlled ever since.  At that time she was on Keppra 500 (4) tabs daily with no seizure activity in several years.  She has been on Lyrica,  Carbamazepine, and  Topamax in the past with side effects.  The patient's last seizure was in 2008.   REVIEW OF SYSTEMS: Full 14 system review of systems performed and notable only for those listed, all others are neg:  Constitutional: neg  Cardiovascular: neg Ear/Nose/Throat: neg  Skin: neg Eyes: neg Respiratory: neg Gastroitestinal: neg  Hematology/Lymphatic: neg  Endocrine: neg Musculoskeletal: Right knee pain recent knee surgery Allergy/Immunology: neg Neurological: History of seizure disorder Psychiatric: neg Sleep : neg   ALLERGIES: Allergies  Allergen Reactions  . Thallous Chloride Tl 201 Hives    Contrast dye for cardiac stress test.  Had increased heart rate, hives.  . Morphine And Related Other (See Comments)    L leg (paralysis). Lasted 18-24 hours.   L leg (paralysis). Lasted 18-24 hours.    . Other     Banana peppers  . Penicillins   . Beef Extract Rash  . Thallium Hives    Contrast dye for cardiac stress test.  Had increased heart rate, hives.    HOME MEDICATIONS: Outpatient Medications Prior to Visit  Medication Sig Dispense Refill  . EPINEPHrine (EPIPEN 2-PAK) 0.3 mg/0.3 mL IJ SOAJ injection Inject 0.3 mg into the muscle.    . levETIRAcetam (KEPPRA XR) 500 MG 24 hr tablet TAKE 2 TABLETS BY MOUTH IN THE MORNING, 3 TABS IN THE EVENING (ABOUT 12 HOURS APART) 150 tablet 11   No facility-administered medications  prior to visit.     PAST MEDICAL HISTORY: Past Medical History:  Diagnosis Date  . Seizures (HCC)     PAST SURGICAL HISTORY: Past Surgical History:  Procedure Laterality Date  . APPENDECTOMY  01/2011  . COLONOSCOPY     removed pre-cancerous polyps  . KNEE SURGERY Right 12/08/2016    FAMILY HISTORY: Family History  Problem Relation Age of Onset  . Colon cancer Mother   . Heart failure Father   . Breast cancer Unknown        Aunt  . Epilepsy Sister   . Skin cancer Sister     SOCIAL HISTORY: Social History   Socioeconomic History  . Marital  status: Married    Spouse name: Not on file  . Number of children: Not on file  . Years of education: Not on file  . Highest education level: Not on file  Occupational History  . Not on file  Social Needs  . Financial resource strain: Not on file  . Food insecurity:    Worry: Not on file    Inability: Not on file  . Transportation needs:    Medical: Not on file    Non-medical: Not on file  Tobacco Use  . Smoking status: Former Games developer  . Smokeless tobacco: Never Used  . Tobacco comment: Quit- 1995  Substance and Sexual Activity  . Alcohol use: No    Comment: Quit in 1989  . Drug use: No  . Sexual activity: Not on file  Lifestyle  . Physical activity:    Days per week: Not on file    Minutes per session: Not on file  . Stress: Not on file  Relationships  . Social connections:    Talks on phone: Not on file    Gets together: Not on file    Attends religious service: Not on file    Active member of club or organization: Not on file    Attends meetings of clubs or organizations: Not on file    Relationship status: Not on file  . Intimate partner violence:    Fear of current or ex partner: Not on file    Emotionally abused: Not on file    Physically abused: Not on file    Forced sexual activity: Not on file  Other Topics Concern  . Not on file  Social History Narrative   Patient is married and lives with her husband and son.   Patient is a Runner, broadcasting/film/video in [place].   Patient is right-handed.   Patient drinks one cup of coffee daily.     PHYSICAL EXAM  Vitals:   10/03/18 0827  BP: 118/78  Pulse: 97  Weight: 236 lb 3.2 oz (107.1 kg)  Height: 5\' 6"  (1.676 m)   Body mass index is 38.12 kg/m.  Generalized: Well developed, obese female in no acute distress  Head: normocephalic and atraumatic,. Oropharynx benign  Neck: Supple,  Musculoskeletal: No deformity   Neurological examination   Mentation: Alert oriented to time, place, history taking. Attention span  and concentration appropriate. Recent and remote memory intact.  Follows all commands speech and language fluent.   Cranial nerve II-XII: Pupils were equal round reactive to light extraocular movements were full, visual field were full on confrontational test. Facial sensation and strength were normal. hearing was intact to finger rubbing bilaterally. Uvula tongue midline. head turning and shoulder shrug were normal and symmetric.Tongue protrusion into cheek strength was normal. Motor: normal bulk and tone, full strength  in the BUE, BLE, except right lower leg due to surgery  Sensory: normal and symmetric to light touch, , in the upper and lower extremities Coordination: finger-nose-finger, heel-to-shin bilaterally, no dysmetria Reflexes: Symmetric upper and lower plantar responses were flexor bilaterally. Gait and Station: Rising up from seated position without assistance, wide based  Stance  ambulates with crutches DIAGNOSTIC DATA (LABS, IMAGING, TESTING) - I reviewed patient records, labs, notes, testing and imaging myself where available.  Lab Results  Component Value Date   WBC 7.4 04/06/2016   HGB 14.1 04/06/2016   HCT 41.4 04/06/2016   MCV 92 04/06/2016   PLT 232 04/06/2016      Component Value Date/Time   NA 142 04/06/2016 1005   K 5.2 04/06/2016 1005   CL 104 04/06/2016 1005   CO2 26 04/06/2016 1005   GLUCOSE 83 04/06/2016 1005   BUN 11 04/06/2016 1005   CREATININE 0.72 04/06/2016 1005   CALCIUM 10.0 04/06/2016 1005   PROT 6.7 04/06/2016 1005   ALBUMIN 4.5 04/06/2016 1005   AST 13 04/06/2016 1005   ALT 10 04/06/2016 1005   ALKPHOS 58 04/06/2016 1005   BILITOT 0.3 04/06/2016 1005   GFRNONAA 100 04/06/2016 1005   GFRAA 115 04/06/2016 1005    ASSESSMENT AND PLAN  52 y.o. year old female  has a past medical history of Seizures (HCC). here To follow-up for seizure disorder .     PLAN: Continue Keppra current dose will refill for 1 year Call for any seizure  activity Follow-up yearly and when necessary I spent 15 min  in total face to face time with the patient more than 50% of which was spent counseling and coordination of care, reviewing test results reviewing medications and discussing and reviewing the diagnosis of seizure disorder and most common seizure triggers such as pain, sleep deprivation, low blood sugar, etc. Nilda Riggs, [facility], [facility], APRN  [facility] [address] [place], [place] [address] [phone]

## 2018-10-03 ENCOUNTER — Encounter: Payer: Self-pay | Admitting: Nurse Practitioner

## 2018-10-03 ENCOUNTER — Ambulatory Visit: Payer: BC Managed Care – PPO | Admitting: Nurse Practitioner

## 2018-10-03 VITALS — BP 118/78 | HR 97 | Ht 66.0 in | Wt 236.2 lb

## 2018-10-03 DIAGNOSIS — G40909 Epilepsy, unspecified, not intractable, without status epilepticus: Secondary | ICD-10-CM | POA: Diagnosis not present

## 2018-10-03 MED ORDER — LEVETIRACETAM ER 500 MG PO TB24: ORAL_TABLET | ORAL | 3 refills | Status: DC

## 2018-10-03 NOTE — Patient Instructions (Signed)
Continue Keppra current dose will refill for 1 year Call for any seizure activity Follow-up yearly and when necessary

## 2019-09-05 ENCOUNTER — Emergency Department (HOSPITAL_COMMUNITY)
Admission: EM | Admit: 2019-09-05 | Discharge: 2019-09-05 | Disposition: A | Discharge status: Home / Self Care | Payer: BC Managed Care – PPO | Attending: Emergency Medicine | Admitting: Emergency Medicine

## 2019-09-05 ENCOUNTER — Emergency Department (HOSPITAL_COMMUNITY): Payer: BC Managed Care – PPO

## 2019-09-05 ENCOUNTER — Other Ambulatory Visit: Payer: Self-pay

## 2019-09-05 ENCOUNTER — Encounter (HOSPITAL_COMMUNITY): Payer: Self-pay | Admitting: Emergency Medicine

## 2019-09-05 DIAGNOSIS — E669 Obesity, unspecified: Secondary | ICD-10-CM | POA: Insufficient documentation

## 2019-09-05 DIAGNOSIS — Z87891 Personal history of nicotine dependence: Secondary | ICD-10-CM | POA: Insufficient documentation

## 2019-09-05 DIAGNOSIS — R05 Cough: Secondary | ICD-10-CM | POA: Diagnosis not present

## 2019-09-05 DIAGNOSIS — R531 Weakness: Secondary | ICD-10-CM | POA: Diagnosis present

## 2019-09-05 DIAGNOSIS — U071 COVID-19: Secondary | ICD-10-CM | POA: Diagnosis not present

## 2019-09-05 DIAGNOSIS — R438 Other disturbances of smell and taste: Secondary | ICD-10-CM | POA: Diagnosis not present

## 2019-09-05 DIAGNOSIS — M7918 Myalgia, other site: Secondary | ICD-10-CM | POA: Diagnosis not present

## 2019-09-05 DIAGNOSIS — Z6838 Body mass index (BMI) 38.0-38.9, adult: Secondary | ICD-10-CM | POA: Insufficient documentation

## 2019-09-05 LAB — CBC WITH DIFFERENTIAL/PLATELET
Abs Immature Granulocytes: 0.05 10*3/uL (ref 0.00–0.07)
Basophils Absolute: 0 10*3/uL (ref 0.0–0.1)
Basophils Relative: 1 %
Eosinophils Absolute: 0.2 10*3/uL (ref 0.0–0.5)
Eosinophils Relative: 3 %
HCT: 46.1 % — ABNORMAL HIGH (ref 36.0–46.0)
Hemoglobin: 16.2 g/dL — ABNORMAL HIGH (ref 12.0–15.0)
Immature Granulocytes: 1 %
Lymphocytes Relative: 40 %
Lymphs Abs: 2.2 10*3/uL (ref 0.7–4.0)
MCH: 32.3 pg (ref 26.0–34.0)
MCHC: 35.1 g/dL (ref 30.0–36.0)
MCV: 91.8 fL (ref 80.0–100.0)
Monocytes Absolute: 0.6 10*3/uL (ref 0.1–1.0)
Monocytes Relative: 10 %
Neutro Abs: 2.5 10*3/uL (ref 1.7–7.7)
Neutrophils Relative %: 45 %
Platelets: 192 10*3/uL (ref 150–400)
RBC: 5.02 MIL/uL (ref 3.87–5.11)
RDW: 11.9 % (ref 11.5–15.5)
WBC: 5.5 10*3/uL (ref 4.0–10.5)
nRBC: 0 % (ref 0.0–0.2)

## 2019-09-05 LAB — TROPONIN I (HIGH SENSITIVITY): Troponin I (High Sensitivity): 3 ng/L (ref <18)

## 2019-09-05 LAB — COMPREHENSIVE METABOLIC PANEL
ALT: 30 U/L (ref 0–44)
AST: 22 U/L (ref 15–41)
Albumin: 3.7 g/dL (ref 3.5–5.0)
Alkaline Phosphatase: 59 U/L (ref 38–126)
Anion gap: 9 (ref 5–15)
BUN: 12 mg/dL (ref 6–20)
CO2: 25 mmol/L (ref 22–32)
Calcium: 9 mg/dL (ref 8.9–10.3)
Chloride: 103 mmol/L (ref 98–111)
Creatinine, Ser: 0.86 mg/dL (ref 0.44–1.00)
GFR calc Af Amer: >60 mL/min (ref >60)
GFR calc non Af Amer: >60 mL/min (ref >60)
Glucose, Bld: 99 mg/dL (ref 70–99)
Potassium: 4.5 mmol/L (ref 3.5–5.1)
Sodium: 137 mmol/L (ref 135–145)
Total Bilirubin: 0.4 mg/dL (ref 0.3–1.2)
Total Protein: 6.6 g/dL (ref 6.5–8.1)

## 2019-09-05 LAB — LACTATE DEHYDROGENASE: LDH: 140 U/L (ref 98–192)

## 2019-09-05 LAB — SARS CORONAVIRUS 2 BY RT PCR (HOSPITAL ORDER, PERFORMED IN ~~LOC~~ HOSPITAL LAB): SARS Coronavirus 2: POSITIVE — AB

## 2019-09-05 LAB — C-REACTIVE PROTEIN: CRP: 1.2 mg/dL — ABNORMAL HIGH (ref <1.0)

## 2019-09-05 LAB — PROCALCITONIN: Procalcitonin: <0.1 ng/mL

## 2019-09-05 LAB — TRIGLYCERIDES: Triglycerides: 151 mg/dL — ABNORMAL HIGH (ref <150)

## 2019-09-05 LAB — D-DIMER, QUANTITATIVE: D-Dimer, Quant: 0.47 ug/mL-FEU (ref 0.00–0.50)

## 2019-09-05 LAB — FIBRINOGEN: Fibrinogen: 413 mg/dL (ref 210–475)

## 2019-09-05 LAB — LACTIC ACID, PLASMA: Lactic Acid, Venous: 1.5 mmol/L (ref 0.5–1.9)

## 2019-09-05 LAB — FERRITIN: Ferritin: 120 ng/mL (ref 11–307)

## 2019-09-05 MED ORDER — MORPHINE SULFATE (PF) 4 MG/ML IV SOLN
6.0000 mg | Freq: Once | INTRAVENOUS | Status: DC
  Filled 2019-09-05: qty 2

## 2019-09-05 MED ORDER — ALBUTEROL SULFATE HFA 108 (90 BASE) MCG/ACT IN AERS
6.0000 | INHALATION_SPRAY | RESPIRATORY_TRACT | Status: DC | PRN
  Administered 2019-09-05: 6 via RESPIRATORY_TRACT
  Filled 2019-09-05: qty 6.7

## 2019-09-05 MED ORDER — ONDANSETRON HCL 4 MG/2ML IJ SOLN
4.0000 mg | Freq: Once | INTRAMUSCULAR | Status: AC
  Administered 2019-09-05: 4 mg via INTRAVENOUS
  Filled 2019-09-05: qty 2

## 2019-09-05 MED ORDER — FENTANYL CITRATE (PF) 100 MCG/2ML IJ SOLN: 50.0000 ug | Freq: Once | INTRAMUSCULAR | Status: DC

## 2019-09-05 MED ORDER — SODIUM CHLORIDE 0.9 % IV BOLUS
1000.0000 mL | Freq: Once | INTRAVENOUS | Status: AC
  Administered 2019-09-05: 19:00:00 1000 mL via INTRAVENOUS

## 2019-09-05 NOTE — ED Notes (Signed)
Patient verbalizes understanding of discharge instructions. Opportunity for questioning and answers were provided. Armband removed by staff, pt discharged from ED by wheelchair and helped pt into vehicle with husband to drive pt home

## 2019-09-05 NOTE — Discharge Instructions (Addendum)
Use albuterol inhaler 2 puffs every 4 hours as needed for shortness of breath.  Use your nebulizer machine at home as needed.  Follow instruction below.  Return to the ER if you have any concerns.

## 2019-09-05 NOTE — ED Provider Notes (Signed)
[facility] EMERGENCY DEPARTMENT Provider Note   CSN: [id number] Arrival date & time: 09/05/19  1631     History   Chief Complaint Chief Complaint  Patient presents with  . Weakness    HPI Julie Hampton is a 53 y.o. female.     The history is provided by the patient. No language interpreter was used.  Weakness    53 year old female with history of seizures, recently test positive for COVID-19 presenting complaining of fatigue and cough.  Patient report for the past week she has been feeling bad.  She endorsed having generalized body aches, loss of taste and smells, nonproductive cough, shortness of breath, feeling fatigued, more confused than usual and along with chest tightness.  She went to see her PCP and had tested for COVID which came back positive the next day.  She has been staying at home, self quarantine, however still noticed progressive worsening of her symptoms.  States that she has been taking the cough medication prescribed as well as using her nebulizer at night without adequate relief.  She reach out to her PCP who recommend patient to go to [facility] to be admitted however she was told to come to the ER for evaluation.  At this time she denies having fever, or rash.  She denies any recent sick contact with anyone with COVID-19.  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  She denies nausea vomiting or diarrhea but does endorse decrease in appetite.  Denies any dysuria.  Past Medical History:  Diagnosis Date  . Seizures [facility])     Patient Active Problem List   Diagnosis Date Noted  . Seizure disorder ([place]) 10/01/2017  . Other convulsions 11/10/2013    Past Surgical History:  Procedure Laterality Date  . APPENDECTOMY  01/2011  . COLONOSCOPY     removed pre-cancerous polyps  . KNEE SURGERY Right 12/08/2016     OB History   No obstetric history on file.      Home Medications    Prior to Admission  medications   Medication Sig Start Date End Date Taking? Authorizing Provider  EPINEPHrine (EPIPEN 2-PAK) 0.3 mg/0.3 mL IJ SOAJ injection Inject 0.3 mg into the muscle. 05/16/13   [provider]  levETIRAcetam (KEPPRA XR) 500 MG 24 hr tablet TAKE 2 TABLETS BY MOUTH IN THE MORNING, 3 TABS IN THE EVENING (ABOUT 12 HOURS APART) 10/03/18   Dennie Bible, NP    Family History Family History  Problem Relation Age of Onset  . Colon cancer Mother   . Heart failure Father   . Breast cancer Other        Aunt  . Epilepsy Sister   . Skin cancer Sister     Social History Social History   Tobacco Use  . Smoking status: Former Research scientist (life sciences)  . Smokeless tobacco: Never Used  . Tobacco comment: Quit- 1995  Substance Use Topics  . Alcohol use: No    Comment: Quit in 1989  . Drug use: No     Allergies   Thallous chloride tl 201, Morphine and related, Other, Penicillins, Beef extract, and Thallium   Review of Systems Review of Systems  Neurological: Positive for weakness.  All other systems reviewed and are negative.    Physical Exam Updated Vital Signs BP (!) 131/104 (BP Location: Right Arm)   Pulse (!) 101   Temp 98.4 F (36.9 C) (Oral)   Resp 19   Ht  5\' 6"  (1.676 m)   Wt 107 kg   SpO2 96%   BMI 38.07 kg/m   Physical Exam Vitals signs and nursing note reviewed.  Constitutional:      General: She is not in acute distress.    Appearance: She is well-developed. She is obese. She is not ill-appearing.  HENT:     Head: Atraumatic.  Eyes:     Conjunctiva/sclera: Conjunctivae normal.  Neck:     Musculoskeletal: Neck supple.  Cardiovascular:     Rate and Rhythm: Tachycardia present.     Pulses: Normal pulses.     Heart sounds: Normal heart sounds.  Pulmonary:     Breath sounds: Rhonchi present.  Abdominal:     Palpations: Abdomen is soft.     Tenderness: There is no abdominal tenderness.  Musculoskeletal:        General: No swelling.  Skin:    Findings:  No rash.  Neurological:     Mental Status: She is alert and oriented to person, place, and time.  Psychiatric:        Mood and Affect: Mood normal.      ED Treatments / Results  Labs (all labs ordered are listed, but only abnormal results are displayed) Labs Reviewed  CBC WITH DIFFERENTIAL/PLATELET - Abnormal; Notable for the following components:      Result Value   Hemoglobin 16.2 (*)    HCT 46.1 (*)    All other components within normal limits  C-REACTIVE PROTEIN - Abnormal; Notable for the following components:   CRP 1.2 (*)    All other components within normal limits  TRIGLYCERIDES - Abnormal; Notable for the following components:   Triglycerides 151 (*)    All other components within normal limits  CULTURE, BLOOD (ROUTINE X 2)  CULTURE, BLOOD (ROUTINE X 2)  SARS CORONAVIRUS 2 (HOSPITAL ORDER, PERFORMED IN [facility])  LACTIC ACID, PLASMA  COMPREHENSIVE METABOLIC PANEL  D-DIMER, QUANTITATIVE (NOT AT [facility])  LACTATE DEHYDROGENASE  FERRITIN  FIBRINOGEN  LACTIC ACID, PLASMA  PROCALCITONIN  TROPONIN I (HIGH SENSITIVITY)  TROPONIN I (HIGH SENSITIVITY)    EKG EKG Interpretation  Date/Time:  Tuesday September 05 2019 17:30:51 EDT Ventricular Rate:  93 PR Interval:    QRS Duration: 96 QT Interval:  369 QTC Calculation: 459 R Axis:   52 Text Interpretation:  Sinus rhythm Confirmed by Vanetta MuldersZackowski, Scott [phone]) on 09/05/2019 5:55:55 PM   Radiology Dg Chest Port 1 View  Result Date: 09/05/2019 CLINICAL DATA:  Fatigue. The patient had a positive COVID-19 test last week. EXAM: PORTABLE CHEST 1 VIEW COMPARISON:  None. FINDINGS: The heart, hila, mediastinum are unremarkable. The right lung is clear. There may be minimal hazy opacity in the lateral left lung. IMPRESSION: There may be minimal hazy opacity in the lateral left mid lung. This could be artifact given the rotated portable technique. A PA and lateral chest x-ray could further evaluate if clinically  warranted. No other abnormalities. Electronically Signed   By: Gerome Samavid  Williams III M.D   On: 09/05/2019 17:30    Procedures Procedures (including critical care time)  Medications Ordered in ED Medications  albuterol (VENTOLIN HFA) 108 (90 Base) MCG/ACT inhaler 6 puff (6 puffs Inhalation Given 09/05/19 1850)  fentaNYL (SUBLIMAZE) injection 50 mcg (has no administration in time range)  sodium chloride 0.9 % bolus 1,000 mL (1,000 mLs Intravenous New Bag/Given 09/05/19 1849)  ondansetron (ZOFRAN) injection 4 mg (4 mg Intravenous Given 09/05/19 1849)     Initial  Impression / Assessment and Plan / ED Course  I have reviewed the triage vital signs and the nursing notes.  Pertinent labs & imaging results that were available during my care of the patient were reviewed by me and considered in my medical decision making (see chart for details).        BP (!) 146/89   Pulse (!) 111   Temp 98.4 F (36.9 C) (Oral)   Resp 18   Ht 5\' 6"  (1.676 m)   Wt 107 kg   SpO2 94%   BMI 38.07 kg/m    Final Clinical Impressions(s) / ED Diagnoses   Final diagnoses:  COVID-19    ED Discharge Orders    None     6:30 PM Patient here with COVID 19 symptoms which include generalized body aches, nonproductive cough, shortness of breath, loss of taste and smell and generalized fatigue.  Symptom has been ongoing for the past week.  She is here due to progressively worsening of the symptom.  Patient however is not hypoxic, and afebrile.  Her labs are mostly reassuring, portable chest x-ray shows maybe minimal hazy opacity in the lateral left midlung that could be an artifact.   7:14 PM Labs are reassuring, no hypoxia, I have discussed finding with patient and husband and felt the patient is stable for discharge.  She can use albuterol inhaler as needed along with her home nebulizer.  She understands to return promptly if her symptoms progress or she has significant shortness of breath requiring hospital  admission.  Work note provided.  Julie Hampton was evaluated in Emergency Department on 09/05/2019 for the symptoms described in the history of present illness. She was evaluated in the context of the global COVID-19 pandemic, which necessitated consideration that the patient might be at risk for infection with the SARS-CoV-2 virus that causes COVID-19. Institutional protocols and algorithms that pertain to the evaluation of patients at risk for COVID-19 are in a state of rapid change based on information released by regulatory bodies including the CDC and federal and state organizations. These policies and algorithms were followed during the patient's care in the ED.    09/07/2019, PA-C 09/05/19 1917    09/07/19, MD 09/18/19 1758

## 2019-09-05 NOTE — ED Triage Notes (Signed)
Pt presents with extreme fatigue, confusion, chest tightness and cough. Covid + at her PCP last week, s/s worse since that time.  Pt states she was told to go to [facility] but was turned away and told to come to ED.

## 2019-09-10 LAB — CULTURE, BLOOD (ROUTINE X 2)
Culture: NO GROWTH
Culture: NO GROWTH
Special Requests: ADEQUATE

## 2019-10-03 ENCOUNTER — Encounter: Payer: Self-pay | Admitting: Family Medicine

## 2019-10-03 ENCOUNTER — Telehealth (INDEPENDENT_AMBULATORY_CARE_PROVIDER_SITE_OTHER): Payer: BC Managed Care – PPO | Admitting: Family Medicine

## 2019-10-03 DIAGNOSIS — G40909 Epilepsy, unspecified, not intractable, without status epilepticus: Secondary | ICD-10-CM

## 2019-10-03 MED ORDER — LEVETIRACETAM ER 500 MG PO TB24: ORAL_TABLET | ORAL | 3 refills | Status: DC

## 2019-10-03 NOTE — Progress Notes (Signed)
PATIENT: Julie Hampton DOB: May 24, 1966  REASON FOR VISIT: follow up HISTORY FROM: patient  Virtual Visit via Telephone Note  I connected with Ellen Henri on 10/03/19 at  9:00 AM EDT by telephone and verified that I am speaking with the correct person using two identifiers.   I discussed the limitations, risks, security and privacy concerns of performing an evaluation and management service by telephone and the availability of in person appointments. I also discussed with the patient that there may be a patient responsible charge related to this service. The patient expressed understanding and agreed to proceed.   History of Present Illness:  10/03/19 MERCEDEES Hampton is a 53 y.o. female here today for follow up for seizure. She continues Keppra XR 1000mg  in am and 1500mg  in pm. Last seizure in 2008.  She is doing very well on Keppra with no obvious adverse effects.  She was recently diagnosed with COVID-19.  She is recovering well.  She is seen regularly by her PCP.  She is working full-time as a , currently working remotely.  She is able to drive and perform all ADLs.   History (copied from 2009 note on 10/03/2018)  UPDATE 10/28/2019CM Julie Hampton, 53 year old female returns for follow-up with history of seizure disorder.  Her last seizure occurred in 2008.  She is currently on Keppra twice daily release 500 mg 2 tabs in the morning and 3 tabs 12 hours later.  She denies side effects to the drug.  She has had an additional 2 knee surgery since last seen.  She is 50% weightbearing on the right.  She ambulates with crutches she will not go back to work until January.  She has not had any falls.  She returns for reevaluation  UPDATE 10/01/2017 CMMr. Hampton, 53 year old female returns for follow-up with a history of seizure disorder. She had her last seizure event in 2008. She is currently on Keppra extended release 500 mg 2 tablets in the morning and 3 at night. She had  knee  surgery in January and is still having significant knee pain. She is going back to [facility] this afternoon repeat MRI. 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 She denies any falls. She ambulates with a single-point cane. She returns for reevaluation  04/10/16 CDMs. 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 She has been married 28 years and her adult son is working at 06/10/16 as a 57. She has a history of seizure disorder for greater than 30 years, changed to GNA in 2006 and was seen by Dr Company secretary.  She was treated on multiple medications, polypharmacy and I referred her to EMU at [facility], she returned on only one drug, KEPPRA, and has been controlled ever since. At that time she was on Keppra 500 (4) tabs daily with no seizure activity in several years.  She has been on Lyrica, Carbamazepine, and Topamax in the past with side effects.  The patient's last seizure was in 2008.    Observations/Objective:  Generalized: Well developed, in no acute distress  Mentation: Alert oriented to time, place, history taking. Follows all commands speech and language fluent   Assessment and Plan:  53 y.o. year old female  has a past medical history of Seizures (HCC). here with  ICD-10-CM   1. Seizure disorder ([place])  G40.909    Julie Hampton is doing very well.  Seizures are well controlled on Keppra 1000 mg in the a.m. and 1500 mg in the p.m.  No seizure activity since 2008.  She was advised to continue follow-up with primary care for annual labs.  I have called in refills of Keppra for 1 year.  She will call with any new seizure activity.  Otherwise, we will see her in 1 year.  She verbalizes understanding and agreement with this plan.  No orders of the defined types were placed in this encounter.   Meds ordered this encounter   Medications  . levETIRAcetam (KEPPRA XR) 500 MG 24 hr tablet    Sig: TAKE 2 TABLETS BY MOUTH IN THE MORNING, 3 TABS IN THE EVENING (ABOUT 12 HOURS APART)    Dispense:  450 tablet    Refill:  3    Order Specific Question:   Supervising Provider    Answer:   Melvenia Beam [[id number]]     Follow Up Instructions:  I discussed the assessment and treatment plan with the patient. The patient was provided an opportunity to ask questions and all were answered. The patient agreed with the plan and demonstrated an understanding of the instructions.   The patient was advised to call back or seek an in-person evaluation if the symptoms worsen or if the condition fails to improve as anticipated.  I provided 15 minutes of non-face-to-face time during this encounter.  Patient is located at her place of residence during my chart visit.  Provider is located in the office.   Debbora Presto, NP

## 2019-10-05 ENCOUNTER — Ambulatory Visit: Payer: BC Managed Care – PPO | Admitting: Neurology

## 2020-10-07 ENCOUNTER — Telehealth: Payer: Self-pay | Admitting: Family Medicine

## 2020-10-07 DIAGNOSIS — G40909 Epilepsy, unspecified, not intractable, without status epilepticus: Secondary | ICD-10-CM

## 2020-10-07 MED ORDER — LEVETIRACETAM ER 500 MG PO TB24: ORAL_TABLET | ORAL | 3 refills | Status: DC

## 2020-10-07 NOTE — Telephone Encounter (Signed)
Pt request refill levETIRAcetam (KEPPRA XR) 500 MG 24 hr tablet at [facility] [phone]

## 2020-11-05 IMAGING — DX DG CHEST 1V PORT
1 series · 1 of 1 positions shown · non-contrast
Comparison: None.

CLINICAL DATA: Fatigue. The patient had a positive 36PK8-ME test
last week.

EXAM:
PORTABLE CHEST 1 VIEW

[chest]
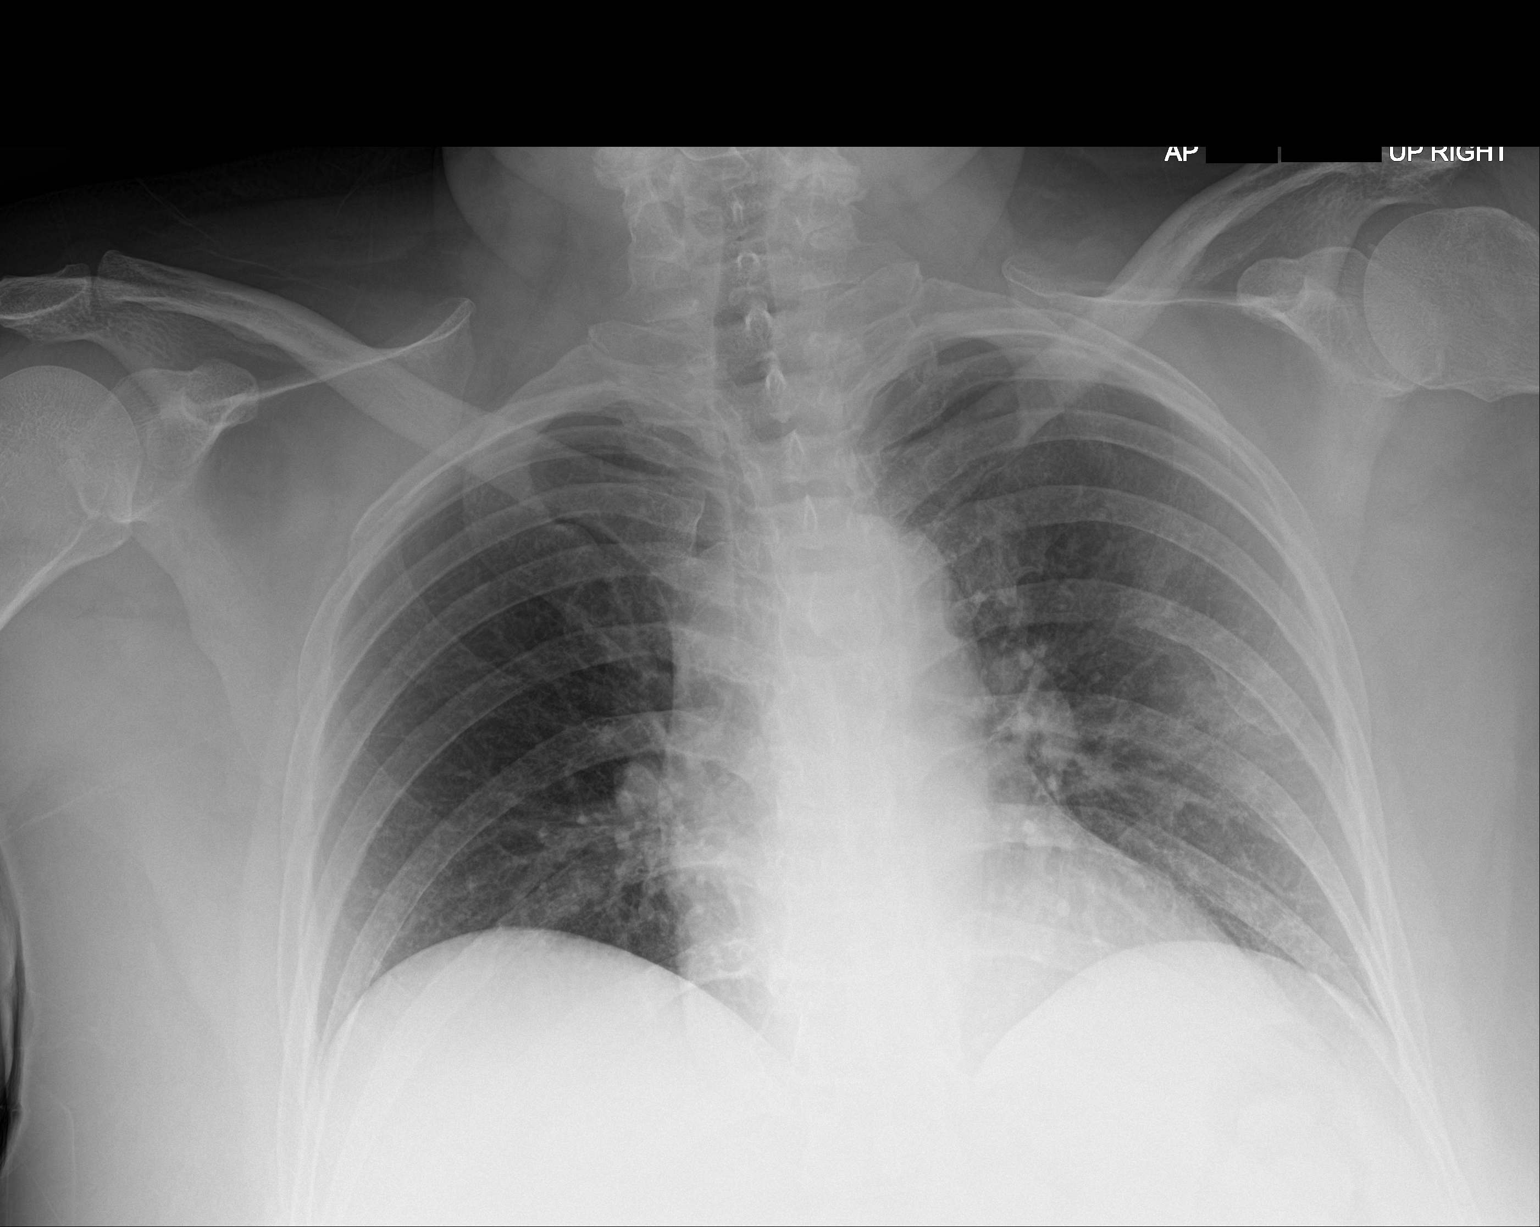

[1 of 1 positions shown; findings below may reference images not displayed]

FINDINGS: The heart, hila, mediastinum are unremarkable. The right lung is
clear. There may be minimal hazy opacity in the lateral left lung.
IMPRESSION: There may be minimal hazy opacity in the lateral left mid lung. This
could be artifact given the rotated portable technique. A PA and
lateral chest x-ray could further evaluate if clinically warranted.
No other abnormalities.

## 2021-01-01 ENCOUNTER — Telehealth: Payer: Self-pay | Admitting: Family Medicine

## 2021-01-01 NOTE — Telephone Encounter (Signed)
Pt understands that although there may be some limitations with this type of visit, we will take all precautions to reduce any security or privacy concerns.  Pt understands that this will be treated like an in office visit and we will file with pt's insurance, and there may be a patient responsible charge related to this service. (pt confirmed no new issues)

## 2021-01-06 ENCOUNTER — Encounter: Payer: Self-pay | Admitting: Family Medicine

## 2021-01-06 ENCOUNTER — Telehealth (INDEPENDENT_AMBULATORY_CARE_PROVIDER_SITE_OTHER): Payer: Self-pay | Admitting: Family Medicine

## 2021-01-06 DIAGNOSIS — G40909 Epilepsy, unspecified, not intractable, without status epilepticus: Secondary | ICD-10-CM

## 2021-01-06 NOTE — Patient Instructions (Signed)
Seizure, Adult A seizure is a sudden burst of abnormal electrical and chemical activity in the brain. Seizures usually last from 30 seconds to 2 minutes.  What are the causes? Common causes of this condition include:  Fever or infection.  Problems that affect the brain. These may include: ? A brain or head injury. ? Bleeding in the brain. ? A brain tumor.  Low levels of blood sugar or salt.  Kidney problems or liver problems.  Conditions that are passed from parent to child (are inherited).  Problems with a substance, such as: ? Having a reaction to a drug or a medicine. ? Stopping the use of a substance all of a sudden (withdrawal).  A stroke.  Disorders that affect how you develop. Sometimes, the cause may not be known.  What increases the risk?  Having someone in your family who has epilepsy. In this condition, seizures happen again and again over time. They have no clear cause.  Having had a tonic-clonic seizure before. This type of seizure causes you to: ? Tighten the muscles of the whole body. ? Lose consciousness.  Having had a head injury or strokes before.  Having had a lack of oxygen at birth. What are the signs or symptoms? There are many types of seizures. The symptoms vary depending on the type of seizure you have. Symptoms during a seizure  Shaking that you cannot control (convulsions) with fast, jerky movements of muscles.  Stiffness of the body.  Breathing problems.  Feeling mixed up (confused).  Staring or not responding to sound or touch.  Head nodding.  Eyes that blink, flutter, or move fast.  Drooling, grunting, or making clicking sounds with your mouth  Losing control of when you pee or poop. Symptoms before a seizure  Feeling afraid, nervous, or worried.  Feeling like you may vomit.  Feeling like: ? You are moving when you are not. ? Things around you are moving when they are not.  Feeling like you saw or heard something before  (dj vu).  Odd tastes or smells.  Changes in how you see. You may see flashing lights or spots. Symptoms after a seizure  Feeling confused.  Feeling sleepy.  Headache.  Sore muscles. How is this treated? If your seizure stops on its own, you will not need treatment. If your seizure lasts longer than 5 minutes, you will normally need treatment. Treatment may include:  Medicines given through an IV tube.  Avoiding things, such as medicines, that are known to cause your seizures.  Medicines to prevent seizures.  A device to prevent or control seizures.  Surgery.  A diet low in carbohydrates and high in fat (ketogenic diet). Follow these instructions at home: Medicines  Take over-the-counter and prescription medicines only as told by your doctor.  Avoid foods or drinks that may keep your medicine from working, such as alcohol. Activity  Follow instructions about driving, swimming, or doing things that would be dangerous if you had another seizure. Wait until your doctor says it is safe for you to do these things.  If you live in the [place] ask your local department of motor vehicles when you can drive.  Get a lot of rest. Teaching others  Teach friends and family what to do when you have a seizure. They should: ? Help you get down to the ground. ? Protect your head and body. ? Loosen any clothing around your neck. ? Turn you on your side. ? Know whether or not   you need emergency care. ? Stay with you until you are better.  Also, tell them what not to do if you have a seizure. Tell them: ? They should not hold you down. ? They should not put anything in your mouth.   General instructions  Avoid anything that gives you seizures.  Keep a seizure diary. Write down: ? What you remember about each seizure. ? What you think caused each seizure.  Keep all follow-up visits. Contact a doctor if:  You have another seizure or seizures. Call the doctor each time you  have a seizure.  The pattern of your seizures changes.  You keep having seizures with treatment.  You have symptoms of being sick or having an infection.  You are not able to take your medicine. Get help right away if:  You have any of these problems: ? A seizure that lasts longer than 5 minutes. ? Many seizures in a row and you do not feel better between seizures. ? A seizure that makes it harder to breathe. ? A seizure and you can no longer speak or use part of your body.  You do not wake up right after a seizure.  You get hurt during a seizure.  You feel confused or have pain right after a seizure. These symptoms may be an emergency. Get help right away. Call your local emergency services (911 in the [place]  Do not wait to see if the symptoms will go away.  Do not drive yourself to the hospital. Summary  A seizure is a sudden burst of abnormal electrical and chemical activity in the brain. Seizures normally last from 30 seconds to 2 minutes.  Causes of seizures include illness, injury to the head, low levels of blood sugar or salt, and certain conditions.  Most seizures will stop on their own in less than 5 minutes. Seizures that last longer than 5 minutes are a medical emergency and need treatment right away.  Many medicines are used to treat seizures. Take over-the-counter and prescription medicines only as told by your doctor. This information is not intended to replace advice given to you by your health care provider. Make sure you discuss any questions you have with your health care provider. Document Revised: 05/31/2020 Document Reviewed: 05/31/2020 Elsevier Patient Education  2021 Elsevier Inc.  

## 2021-01-06 NOTE — Progress Notes (Signed)
PATIENT: Julie Hampton DOB: February 15, 1966  REASON FOR VISIT: follow up HISTORY FROM: patient  Virtual Visit via Telephone Note  I connected with Julie Hampton on 01/06/21 at  3:00 PM EST by telephone and verified that I am speaking with the correct person using two identifiers.   I discussed the limitations, risks, security and privacy concerns of performing an evaluation and management service by telephone and the availability of in person appointments. I also discussed with the patient that there may be a patient responsible charge related to this service. The patient expressed understanding and agreed to proceed.   History of Present Illness:  01/06/21  She returns for seizure follow up. She continues 1000/1500. Last seizure 2008. She is doing very well. She denies any changes in medical history or concerns today. She did have Covid last year and was fairly sick for about 2 months. Fortunately, she has fully recovered. She is vaccinated.    10/03/2019 ALL:  Julie Hampton is a 55 y.o. female here today for follow up for seizure. She continues Keppra XR 1000mg  in am and 1500mg  in pm. Last seizure in 2008.  She is doing very well on Keppra with no obvious adverse effects.  She was recently diagnosed with COVID-19.  She is recovering well.  She is seen regularly by her PCP.  She is working full-time as a , currently working remotely.  She is able to drive and perform all ADLs.   History (copied from 2009 note on 10/03/2018)  UPDATE 10/28/2019CM Julie Hampton, 55 year old female returns for follow-up with history of seizure disorder.  Her last seizure occurred in 2008.  She is currently on Keppra twice daily release 500 mg 2 tabs in the morning and 3 tabs 12 hours later.  She denies side effects to the drug.  She has had an additional 2 knee surgery since last seen.  She is 50% weightbearing on the right.  She ambulates with crutches she will not go back to work until January.  She  has not had any falls.  She returns for reevaluation  UPDATE 10/01/2017 Julie Hampton, 55 year old female returns for follow-up with a history of seizure disorder. She had her last seizure event in 2008. She is currently on Keppra extended release 500 mg 2 tablets in the morning and 3 at night. She had  knee surgery in January and is still having significant knee pain. She is going back to [facility] this afternoon repeat MRI. 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 She denies any falls. She ambulates with a single-point cane. 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 She has been married 28 years and her adult son is working at 06/10/16 as a 57. She has a history of seizure disorder for greater than 30 years, changed to [facility] in 2006 and was seen by Dr Company secretary.  She was treated on multiple medications, polypharmacy and I referred her to EMU at [facility], she returned on only one drug, KEPPRA, and has been controlled ever since. At that time she was on Keppra 500 (4) tabs daily with no seizure activity in several years.  She has been on Lyrica, Carbamazepine, and Topamax in the past with side effects.  The patient's last seizure  was in 2008.    Observations/Objective:  Generalized: Well developed, in no acute distress  Mentation: Alert oriented to time, place, history taking. Follows all commands speech and language fluent   Assessment and Plan:  55 y.o. year old female  has a past medical history of Seizures (HCC). here with    ICD-10-CM   1. Seizure disorder (HCC)  G40.909      Julie Hampton is doing very well.  Seizures are well controlled on levetiracetam XR 1000 mg in the a.m. and 1500 mg in the p.m.  No seizure activity since 2008.  She was advised to continue follow-up with  primary care for annual labs. Refills recently sent for 1 year.  She will call with any new seizure activity or concerns.  Otherwise, we will see her in 1 year.  She verbalizes understanding and agreement with this plan.  No orders of the defined types were placed in this encounter.   No orders of the defined types were placed in this encounter.    Follow Up Instructions:  I discussed the assessment and treatment plan with the patient. The patient was provided an opportunity to ask questions and all were answered. The patient agreed with the plan and demonstrated an understanding of the instructions.   The patient was advised to call back or seek an in-person evaluation if the symptoms worsen or if the condition fails to improve as anticipated.  I provided 15 minutes of non-face-to-face time during this encounter.  Patient is located at her place of residence during my chart visit.  Provider is located in the office.   Shawnie Dapper, NP

## 2021-01-20 ENCOUNTER — Encounter: Payer: Self-pay | Admitting: Family Medicine

## 2021-04-08 ENCOUNTER — Encounter: Payer: Self-pay | Admitting: Family Medicine

## 2021-08-28 ENCOUNTER — Other Ambulatory Visit: Payer: Self-pay | Admitting: Neurology

## 2021-08-28 DIAGNOSIS — G40909 Epilepsy, unspecified, not intractable, without status epilepticus: Secondary | ICD-10-CM

## 2021-08-28 MED ORDER — LEVETIRACETAM ER 500 MG PO TB24: ORAL_TABLET | ORAL | 0 refills | Status: DC

## 2021-12-30 ENCOUNTER — Telehealth: Payer: Self-pay | Admitting: Family Medicine

## 2021-12-30 NOTE — Telephone Encounter (Signed)
Pt returned call and scheduled VV for 02/17/22 with Amy. She is requesting refills on her Keppra in the meantime.

## 2021-12-30 NOTE — Telephone Encounter (Signed)
Called pt back. Offered sooner VV w/ AL,NP 12/31/21 at 1030am. She accepted. I cx March appt. Pt aware to log on about 1015a, upload new insurance cards/meds list.  She has two days left of medication and aware refill request will be addressed during appt tomorrow.

## 2021-12-30 NOTE — Telephone Encounter (Signed)
LVM for Pt to call to office to schedule a f/u appt to be able to do refill for levETIRAcetam (KEPPRA XR) 500 MG 24 hr tablet

## 2021-12-31 ENCOUNTER — Encounter: Payer: Self-pay | Admitting: Family Medicine

## 2021-12-31 ENCOUNTER — Telehealth (INDEPENDENT_AMBULATORY_CARE_PROVIDER_SITE_OTHER): Payer: BC Managed Care – PPO | Admitting: Family Medicine

## 2021-12-31 DIAGNOSIS — G40909 Epilepsy, unspecified, not intractable, without status epilepticus: Secondary | ICD-10-CM

## 2021-12-31 MED ORDER — LEVETIRACETAM ER 500 MG PO TB24: ORAL_TABLET | ORAL | 3 refills | Status: DC

## 2021-12-31 NOTE — Progress Notes (Signed)
PATIENT: Julie Hampton DOB: 20-Feb-1966  REASON FOR VISIT: follow up HISTORY FROM: patient  Virtual Visit via Telephone Note  I connected with Julie Hampton on 12/31/21 at 10:30 AM EST by telephone and verified that I am speaking with the correct person using two identifiers.   I discussed the limitations, risks, security and privacy concerns of performing an evaluation and management service by telephone and the availability of in person appointments. I also discussed with the patient that there may be a patient responsible charge related to this service. The patient expressed understanding and agreed to proceed.   History of Present Illness:  12/31/21 ALL: Julie Hampton is a 56 y.o. female here today for follow up for seizure disorder. She continues to do well on levetiracetam 1000mg  in the am and 1500mg  in the pm. No seizures, last seizure 2008. She is tolerating medication well. She is followed by PCP annually. Labs are reportedly normal. She is feeling well and without complaints.   01/06/21 ALL: She returns for seizure follow up. She continues 1000/1500. Last seizure 2008. She is doing very well. She denies any changes in medical history or concerns today. She did have Covid last year and was fairly sick for about 2 months. Fortunately, she has fully recovered. She is vaccinated.    10/03/2019 ALL:  Julie Hampton is a 56 y.o. female here today for follow up for seizure. She continues Keppra XR 1000mg  in am and 1500mg  in pm. Last seizure in 2008.  She is doing very well on Keppra with no obvious adverse effects.  She was recently diagnosed with COVID-19.  She is recovering well.  She is seen regularly by her PCP.  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  She is able to drive and perform all ADLs.   Observations/Objective:  Generalized: Well developed, in no acute distress  Mentation: Alert oriented to time, place, history taking. Follows all commands speech  and language fluent   Assessment and Plan:  56 y.o. year old female  has a past medical history of Seizures (HCC). here with    ICD-10-CM   1. Seizure disorder (HCC)  G40.909 levETIRAcetam (KEPPRA XR) 500 MG 24 hr tablet      [name] is doing very well. We will continue levetiracetam 1000mg  in the morning and 1500mg  at bedtime. She will continue regular follow up with PCP. Healthy lifestyle habits encouraged. Seizure precautions reviewed. She will follow up with me in 1 year, sooner if needed.   No orders of the defined types were placed in this encounter.   Meds ordered this encounter  Medications   levETIRAcetam (KEPPRA XR) 500 MG 24 hr tablet    Sig: TAKE 2 TABLETS BY MOUTH IN THE MORNING, 3 TABS IN THE EVENING (ABOUT 12 HOURS APART)    Dispense:  450 tablet    Refill:  3    Order Specific Question:   Supervising Provider    Answer:   2009 Runner, broadcasting/film/video     Follow Up Instructions:  I discussed the assessment and treatment plan with the patient. The patient was provided an opportunity to ask questions and all were answered. The patient agreed with the plan and demonstrated an understanding of the instructions.   The patient was advised to call back or seek an in-person evaluation if the symptoms worsen or if the condition fails to improve as anticipated.  I provided 15 minutes of non-face-to-face time during this encounter.  Patient located at their place of residence during Mychart visit. Provider is in the office.    Shawnie Dapper, NP

## 2021-12-31 NOTE — Patient Instructions (Signed)
Below is our plan:  We will continue levetiracetam 1000mg  in the am and 1500mg  at bedtime.   Please make sure you are staying well hydrated. I recommend 50-60 ounces daily. Well balanced diet and regular exercise encouraged. Consistent sleep schedule with 6-8 hours recommended.   Please continue follow up with care team as directed.   Follow up with me in 1 year   You may receive a survey regarding today's visit. I encourage you to leave honest feed back as I do use this information to improve patient care. Thank you for seeing me today!

## 2022-02-17 ENCOUNTER — Telehealth: Payer: Self-pay | Admitting: Family Medicine

## 2022-08-10 ENCOUNTER — Emergency Department (HOSPITAL_COMMUNITY): Payer: BC Managed Care – PPO

## 2022-08-10 ENCOUNTER — Emergency Department (HOSPITAL_COMMUNITY)
Admission: EM | Admit: 2022-08-10 | Discharge: 2022-08-10 | Disposition: A | Discharge status: Home / Self Care | Payer: BC Managed Care – PPO | Attending: Emergency Medicine | Admitting: Emergency Medicine

## 2022-08-10 ENCOUNTER — Encounter (HOSPITAL_COMMUNITY): Payer: Self-pay | Admitting: Emergency Medicine

## 2022-08-10 DIAGNOSIS — R079 Chest pain, unspecified: Secondary | ICD-10-CM | POA: Diagnosis present

## 2022-08-10 LAB — CBC
HCT: 43.2 % (ref 36.0–46.0)
Hemoglobin: 14.6 g/dL (ref 12.0–15.0)
MCH: 31.1 pg (ref 26.0–34.0)
MCHC: 33.8 g/dL (ref 30.0–36.0)
MCV: 92.1 fL (ref 80.0–100.0)
Platelets: 244 10*3/uL (ref 150–400)
RBC: 4.69 MIL/uL (ref 3.87–5.11)
RDW: 12.1 % (ref 11.5–15.5)
WBC: 6.6 10*3/uL (ref 4.0–10.5)
nRBC: 0 % (ref 0.0–0.2)

## 2022-08-10 LAB — BASIC METABOLIC PANEL
Anion gap: 12 (ref 5–15)
BUN: 13 mg/dL (ref 6–20)
CO2: 22 mmol/L (ref 22–32)
Calcium: 9.9 mg/dL (ref 8.9–10.3)
Chloride: 108 mmol/L (ref 98–111)
Creatinine, Ser: 0.78 mg/dL (ref 0.44–1.00)
GFR, Estimated: >60 mL/min (ref >60)
Glucose, Bld: 112 mg/dL — ABNORMAL HIGH (ref 70–99)
Potassium: 4.1 mmol/L (ref 3.5–5.1)
Sodium: 142 mmol/L (ref 135–145)

## 2022-08-10 LAB — BRAIN NATRIURETIC PEPTIDE: B Natriuretic Peptide: 5.6 pg/mL (ref 0.0–100.0)

## 2022-08-10 LAB — TROPONIN I (HIGH SENSITIVITY)
Troponin I (High Sensitivity): 5 ng/L (ref <18)
Troponin I (High Sensitivity): 5 ng/L (ref <18)

## 2022-08-10 MED ORDER — METHYLPREDNISOLONE SODIUM SUCC 40 MG IJ SOLR
40.0000 mg | Freq: Once | INTRAMUSCULAR | Status: AC
  Administered 2022-08-10: 40 mg via INTRAVENOUS
  Filled 2022-08-10: qty 1

## 2022-08-10 MED ORDER — ASPIRIN 325 MG PO TABS
325.0000 mg | ORAL_TABLET | Freq: Every day | ORAL | Status: DC
  Administered 2022-08-10: 325 mg via ORAL
  Filled 2022-08-10: qty 1

## 2022-08-10 MED ORDER — LACTATED RINGERS IV BOLUS
1000.0000 mL | Freq: Once | INTRAVENOUS | Status: AC
  Administered 2022-08-10: 1000 mL via INTRAVENOUS

## 2022-08-10 MED ORDER — DIPHENHYDRAMINE HCL 50 MG/ML IJ SOLN
50.0000 mg | Freq: Once | INTRAMUSCULAR | Status: AC
  Administered 2022-08-10: 50 mg via INTRAVENOUS
  Filled 2022-08-10: qty 1

## 2022-08-10 MED ORDER — DIPHENHYDRAMINE HCL 25 MG PO CAPS: 50.0000 mg | ORAL_CAPSULE | Freq: Once | ORAL | Status: AC

## 2022-08-10 MED ORDER — IOHEXOL 350 MG/ML SOLN
45.0000 mL | Freq: Once | INTRAVENOUS | Status: AC | PRN
  Administered 2022-08-10: 45 mL via INTRAVENOUS

## 2022-08-10 NOTE — ED Notes (Signed)
Patient transported to CT 

## 2022-08-10 NOTE — ED Provider Notes (Signed)
[facility] EMERGENCY DEPARTMENT Provider Note   CSN: [id number] Arrival date & time: 08/10/22  1229     History Chief Complaint  Patient presents with   Chest Pain    HPI Julie Hampton is a 56 y.o. female presenting for chief complaint of chest pain.  She states that she has had 3 episodes of chest pain over the last 3 days.  She has a history of obesity, frequent leg swelling.  She states that she has been active over the last 3 days going to different festivals over the holiday and states that every day she has had episodes of palpitations and near syncope.  She denies fevers or chills nausea or vomiting, syncope shortness of breath.  She is currently asymptomatic.  She states that she monitored her heart rate at home and it approached 130 earlier today. She denies any history of CAD.  She had a coronary angiography for similar symptoms in 2015 that was without evidence of obstructive lesions. Patient ambulatory tolerating p.o. intake in no acute distress.  Patient's recorded medical, surgical, social, medication list and allergies were reviewed in the Snapshot window as part of the initial history.   Review of Systems   Review of Systems  Constitutional:  Negative for chills and fever.  HENT:  Negative for ear pain and sore throat.   Eyes:  Negative for pain and visual disturbance.  Respiratory:  Negative for cough and shortness of breath.   Cardiovascular:  Positive for chest pain and palpitations.  Gastrointestinal:  Negative for abdominal pain and vomiting.  Genitourinary:  Negative for dysuria and hematuria.  Musculoskeletal:  Negative for arthralgias and back pain.  Skin:  Negative for color change and rash.  Neurological:  Positive for light-headedness. Negative for seizures and syncope.  All other systems reviewed and are negative.   Physical Exam Updated Vital Signs BP 128/75   Pulse 90   Temp 98.4 F (36.9 C) (Oral)   Resp 20   SpO2 96%   Physical Exam Vitals and nursing note reviewed.  Constitutional:      General: She is not in acute distress.    Appearance: She is well-developed.  HENT:     Head: Normocephalic and atraumatic.  Eyes:     Conjunctiva/sclera: Conjunctivae normal.  Cardiovascular:     Rate and Rhythm: Normal rate and regular rhythm.     Heart sounds: No murmur heard. Pulmonary:     Effort: Pulmonary effort is normal. No respiratory distress.     Breath sounds: Normal breath sounds.  Abdominal:     General: There is no distension.     Palpations: Abdomen is soft.     Tenderness: There is no abdominal tenderness. There is no right CVA tenderness or left CVA tenderness.  Musculoskeletal:        General: No swelling or tenderness. Normal range of motion.     Cervical back: Neck supple.  Skin:    General: Skin is warm and dry.  Neurological:     General: No focal deficit present.     Mental Status: She is alert and oriented to person, place, and time. Mental status is at baseline.     Cranial Nerves: No cranial nerve deficit.      ED Course/ Medical Decision Making/ A&P Clinical Course as of 08/10/22 2346  Mon Aug 10, 2022  1924 CTA after pretreatment [CC]    Clinical Course User Index [CC] Glyn Ade, MD    Procedures  Procedures   Medications Ordered in ED Medications  aspirin tablet 325 mg (325 mg Oral Given 08/10/22 1736)  lactated ringers bolus 1,000 mL (0 mLs Intravenous Stopped 08/10/22 2326)  methylPREDNISolone sodium succinate (SOLU-MEDROL) 40 mg/mL injection 40 mg (40 mg Intravenous Given 08/10/22 1818)  diphenhydrAMINE (BENADRYL) capsule 50 mg ( Oral See Alternative 08/10/22 2150)    Or  diphenhydrAMINE (BENADRYL) injection 50 mg (50 mg Intravenous Given 08/10/22 2150)  iohexol (OMNIPAQUE) 350 MG/ML injection 45 mL (45 mLs Intravenous Contrast Given 08/10/22 2249)  Medical Decision Making:  TAMSEN REIST is a 56 y.o. female who presented to the ED today with chest pain, detailed  above.  Based on patient's comorbidities, patient has a heart score of 2.     Patient's presentation is complicated by their history of elevated BMI, seizure disorder on chronic outpatient medication regimen.  Patient placed on continuous vitals and telemetry monitoring while in ED which was reviewed periodically.   Complete initial physical exam performed, notably the patient was hemodynamically stable in no acute distress.    Reviewed and confirmed nursing documentation for past medical history, family history, social history.       Initial Assessment:  With the patient's presentation of left-sided chest pain, most likely diagnosis is musculoskeletal chest pain versus GERD, although ACS remains on the differential. Other diagnoses were considered including (but not limited to) pulmonary embolism, community-acquired pneumonia, aortic dissection, pneumothorax, underlying bony abnormality, anemia. These are considered less likely due to history of present illness and physical exam findings.    This is most consistent with an acute life/limb threatening illness complicated by underlying chronic conditions.   Initial Plan:  Evaluate for ACS with delta troponin and EKG evaluated as below  Evaluate for dissection, bony abnormality, or pneumonia with chest x-ray and screening laboratory evaluation including CBC, BMP  Further evaluation for pulmonary embolism indicated at this time based on patient's PERC and Wells score.  Further evaluation for Thoracic Aortic Dissection not indicated at this time based on patient's clinical history and PE findings.  However, CT angiography of the chest will provide evaluation.         Initial Study Results:  EKG was reviewed independently. Rate, rhythm, axis, intervals all examined and without medically relevant abnormality. ST segments without concerns for elevations.     Laboratory  Delta troponin demonstrated   CBC and BMP without obvious metabolic  or inflammatory abnormalities requiring further evaluation   Radiology  CT Angio Chest Pulmonary Embolism (PE) W or WO Contrast  Final Result    DG Chest 2 View  Final Result     Final Assessment and Plan:   On repeat assessment after 11 hours in the emergency department, patient remains asymptomatic.  She had no further episodes and patient is otherwise ambulatory tolerating p.o. intake.  The paroxysmal nature of patient's symptoms make me favor an arrhythmogenic etiology though no episodes detected on 11 hours of telemetry.  Additionally, angiography without evidence of acute vascular lesion in the chest. She is ambulatory tolerating p.o. intake in no acute distress.  Patient does have risk factors for ACS given her elevated BMI, however her heart score is 2 and she is stable for outpatient reassessment by cardiology given complete resolution of her symptoms.  Patient discharged with no further acute event stable for continued outpatient care and management.  Referral to cardiology placed for close outpatient follow-up.            Clinical Impression:  1.  Chest pain, unspecified type      Discharge   Final Clinical Impression(s) / ED Diagnoses Final diagnoses:  Chest pain, unspecified type    Rx / DC Orders ED Discharge Orders          Ordered    Ambulatory referral to Cardiology        08/10/22 2321              Glyn Ade, MD 08/10/22 2346

## 2022-08-10 NOTE — ED Provider Triage Note (Signed)
Emergency Medicine Provider Triage Evaluation Note  Julie Hampton , a 56 y.o. female  was evaluated in triage.  Pt complains of chest pain. Intermittent x 3 days. Central, radiates to left shoulder. Became constant yesterday. N, no vomitting. Feels SOB. Marland Kitchen  Review of Systems  Per HPI  Physical Exam  BP (!) 126/93 (BP Location: Right Arm)   Pulse 97   Temp 98.3 F (36.8 C) (Oral)   Resp 18   SpO2 96%  Gen:   Awake, no distress   Resp:  Normal effort  MSK:   Right knee in brace, chronic Other:  Mild tachycardia   Medical Decision Making  Medically screening exam initiated at 12:57 PM.  Appropriate orders placed.  Julie Hampton was informed that the remainder of the evaluation will be completed by another provider, this initial triage assessment does not replace that evaluation, and the importance of remaining in the ED until their evaluation is complete.     Theron Arista, PA-C 08/10/22 1257

## 2022-08-10 NOTE — ED Triage Notes (Signed)
Patient with chest pain x3 days, middle of chest pain, feels like heart is fluttering. Denies n/v/d. Endorses [facility]

## 2022-08-10 NOTE — Discharge Instructions (Addendum)
You were seen today for chest pain.  Your evaluation does not reveal any evidence of acute coronary syndrome or thoracic aortic disease or blood clots today.  However your description of your episodes sounds consistent with paroxysmal SVT or another cardiac arrhythmia.  It is critical that you follow-up with a cardiologist to further restratify your disease and to get further work-up in the outpatient setting.  A referral has been made.  You should call the attached phone number to arrange her appointment.  In the interim, follow-up with your primary care provider for reassessment within 48 hours.  And for the opportunity to participate in your care, please return with any worsening change in your syndrome.

## 2022-09-06 NOTE — Progress Notes (Unsigned)
Cardiology Office Note:    Date:  09/07/2022   ID:  Julie Hampton, DOB 1966-10-24, MRN [id number]  PCP:  Ailene Ravel, MD   [facility] Providers Cardiologist:  Christell Constant, MD     Referring MD: Glyn Ade, MD   CC: Three days of elevated heart rates Consulted for the evaluation of chest pain at the behest of Dr. Doran Durand  History of Present Illness:    Julie Hampton is a 56 y.o. female with a hx of chest pain, palpitations, and near syncope presenting to establish care.  Has history of SVT.  Patient notes that she is feeling episodes of intermittent palpitations.   Was last feeling well when these did not occur. Presently she is limited due to her right leg.  Since Covid-19 in 2020 has had new DOE that has not improved (no pain). Has chronic bronchitis and has had post COVID-19. No LE swelling on her good leg.  Weight gain since her right leg and she has 5 knee surgeries.   When she has sudden onset palpitations, she had near syncope, palpitations, and shortness of breath.  Had three episodes one month prior saw ED; has since had one further.   No syncope  Patient reports prior cardiac testing including No PE or CAC on 08/10/22 study. Negative stress test with T1 201 but had hives.   Ambulatory BP 125/85.   Past Medical History:  Diagnosis Date   Chest pain    Leg swelling    Near syncope    Obesity    Palpitations    Seizures (HCC)     Past Surgical History:  Procedure Laterality Date   APPENDECTOMY  01/2011   COLONOSCOPY     removed pre-cancerous polyps   KNEE SURGERY Right 12/08/2016    Current Medications: Current Meds  Medication Sig   albuterol (VENTOLIN HFA) 108 (90 Base) MCG/ACT inhaler Inhale 2 puffs into the lungs every 6 (six) hours as needed for wheezing or shortness of breath.   diltiazem (CARDIZEM) 30 MG tablet Take 1 tablet (30 mg total) by mouth every 6 (six) hours as needed (palpitations).   EPINEPHrine  (EPIPEN 2-PAK) 0.3 mg/0.3 mL IJ SOAJ injection Inject 0.3 mg into the muscle as needed for anaphylaxis.   levETIRAcetam (KEPPRA XR) 500 MG 24 hr tablet Take 2,500 mg by mouth at bedtime.   [DISCONTINUED] levETIRAcetam (KEPPRA XR) 500 MG 24 hr tablet TAKE 2 TABLETS BY MOUTH IN THE MORNING, 3 TABS IN THE EVENING (ABOUT 12 HOURS APART) (Patient taking differently: Take 2,500 mg by mouth at bedtime.)     Allergies:   Iodinated contrast media, Penicillins, Thallous chloride tl 201, Morphine and related, Other, Alpha-gal, Beef (bovine) protein, Latex, Strawberry extract, and Thallium   Social History   Socioeconomic History   Marital status: Married    Spouse name: Not on file   Number of children: Not on file   Years of education: Not on file   Highest education level: Not on file  Occupational History   Not on file  Tobacco Use   Smoking status: Former   Smokeless tobacco: Never   Tobacco comments:    Quit- 1995  Substance and Sexual Activity   Alcohol use: No    Comment: Quit in 1989   Drug use: No   Sexual activity: Not on file  Other Topics Concern   Not on file  Social History Narrative   Patient is married and lives with her  husband and son.   Patient is a Runner, broadcasting/film/video in [place].   Patient is right-handed.   Patient drinks one cup of coffee daily.   Social Determinants of Health   Financial Resource Strain: Not on file  Food Insecurity: Not on file  Transportation Needs: Not on file  Physical Activity: Not on file  Stress: Not on file  Social Connections: Not on file    Social: assistant principal married  Family History: The patient's family history includes Breast cancer in an other family member; Colon cancer in her mother; Epilepsy in her sister; Heart failure in her father; Skin cancer in her sister.  ROS:   Please see the history of present illness.     All other systems reviewed and are negative.  EKGs/Labs/Other Studies Reviewed:    The following  studies were reviewed today:  EKG:   08/10/22: NSR 90  Recent Labs: 08/10/2022: B Natriuretic Peptide 5.6; BUN 13; Creatinine, Ser 0.78; Hemoglobin 14.6; Platelets 244; Potassium 4.1; Sodium 142  Recent Lipid Panel    Component Value Date/Time   TRIG 151 (H) 09/05/2019 1715    Physical Exam:    VS:  BP 128/86   Pulse 67   Ht 5\' 6"  (1.676 m)   Wt 256 lb (116.1 kg)   SpO2 98%   BMI 41.32 kg/m     Wt Readings from Last 3 Encounters:  09/07/22 256 lb (116.1 kg)  09/05/19 235 lb 14.3 oz (107 kg)  10/03/18 236 lb 3.2 oz (107.1 kg)    Gen: No distress, morbid obesity   Neck: No JVD Cardiac: No Rubs or Gallops, no murmur, RRR +2 radial pulses Respiratory: Clear to auscultation bilaterally, normal effort, normal  respiratory rate GI: Soft, nontender, non-distended  MS: No  edema;  moves all extremities Integument: Skin feels warm Neuro:  At time of evaluation, alert and oriented to person/place/time/situation  Psych: Normal affect, patient feels well presently   ASSESSMENT:    1. SVT (supraventricular tachycardia)   2. Palpitations   3. DOE (dyspnea on exertion)   4. 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  Concerns regarding medicines are outlined above.  Orders Placed This Encounter  Procedures   LONG TERM MONITOR (3-14 DAYS)   ECHOCARDIOGRAM COMPLETE   Meds ordered this encounter  Medications   diltiazem (CARDIZEM) 30 MG tablet    Sig: Take 1 tablet (30 mg total) by mouth every 6  (six) hours as needed (palpitations).    Dispense:  60 tablet    Refill:  3    Patient Instructions  Medication Instructions:  Your physician has recommended you make the following change in your medication:  START: diltiazem (Cardizem) 30 mg by mouth Every 6 hours as needed for Palpitations  *If you need a refill on your cardiac medications before your next appointment, please call your pharmacy*   Lab Work: NONE If you have labs (blood work) drawn today and your tests are completely normal,  you will receive your results only by: [place] (if you have MyChart) OR A paper copy in the mail If you have any lab test that is abnormal or we need to change your treatment, we will call you to review the results.   Testing/Procedures: Your physician has requested that you have an echocardiogram. Echocardiography is a painless test that uses sound waves to create images of your heart. It provides your doctor with information about the size and shape of your heart and how well your heart's chambers and valves are working. This procedure takes approximately one hour. There are no restrictions for this procedure.  Your physician has requested that you wear a 14 day heart monitor.    Follow-Up: At [facility], you and your health needs are our priority.  As part of our continuing mission to provide you with exceptional heart care, we have created designated Provider Care Teams.  These Care Teams include your primary Cardiologist (physician) and Advanced Practice Providers (APPs -  Physician Assistants and Nurse Practitioners) who all work together to provide you with the care you need, when you need it.   Your next appointment:   3 month(s)  The format for your next appointment:   In Person  Provider:   Melina Copa, PA-C, Ambrose Pancoast, NP, or Ermalinda Barrios, PA-C         Other Instructions Bryn Gulling- Long Term Monitor Instructions  Your physician has requested you wear a ZIO  patch monitor for 14 days.  This is a single patch monitor. [employer] supplies one patch monitor per enrollment. Additional stickers are not available. Please do not apply patch if you will be having a Nuclear Stress Test,  Cardiac CT, MRI, or Chest Xray during the period you would be wearing the  monitor. The patch cannot be worn during these tests. You cannot remove and re-apply the  ZIO XT patch monitor.  Your ZIO patch monitor will be mailed 3 day USPS to your address on file. It may take 3-5 days  to receive your monitor after you have been enrolled.  Once you have received your monitor, please review the enclosed instructions. Your monitor  has already been registered assigning a specific monitor serial # to you.  Billing and Patient Assistance Program Information  We have supplied [employer] with any of your insurance information on file for billing purposes. [employer] offers a sliding scale Patient Assistance Program for patients that do not have  insurance, or whose insurance does not completely cover the cost of the ZIO monitor.  You must apply for the Patient Assistance Program to qualify for this discounted rate.  To apply, please call [employer] at [phone], select option 4, select option 2, ask to apply for  Patient Assistance Program. Theodore Demark will ask your household income, and how many people  are in your household. They will quote your out-of-pocket cost based on that information.  [employer] will also be able to set up a 1-month, interest-free payment plan if needed.  Applying the monitor   Shave hair from upper left chest.  Hold abrader disc by orange tab. Rub abrader in 40 strokes over the upper left chest as  indicated in your monitor instructions.  Clean area with 4 enclosed alcohol pads. Let dry.  Apply patch as indicated in monitor instructions. Patch will be placed under collarbone on left  side of chest with arrow pointing upward.  Rub patch adhesive wings for 2  minutes. Remove white label marked "  1". Remove the white  label marked "2". Rub patch adhesive wings for 2 additional minutes.  While looking in a mirror, press and release button in center of patch. A small green light will  flash 3-4 times. This will be your only indicator that the monitor has been turned on.  Do not shower for the first 24 hours. You may shower after the first 24 hours.  Press the button if you feel a symptom. You will hear a small click. Record Date, Time and  Symptom in the Patient Logbook.  When you are ready to remove the patch, follow instructions on the last 2 pages of Patient  Logbook. Stick patch monitor onto the last page of Patient Logbook.  Place Patient Logbook in the blue and white box. Use locking tab on box and tape box closed  securely. The blue and white box has prepaid postage on it. Please place it in the mailbox as  soon as possible. Your physician should have your test results approximately 7 days after the  monitor has been mailed back to [facility].  Call [facility] Customer Care at [phone] if you have questions regarding  your ZIO XT patch monitor. Call them immediately if you see an orange light blinking on your  monitor.  If your monitor falls off in less than 4 days, contact our Monitor department at [phone].  If your monitor becomes loose or falls off after 4 days call [employer] at [phone] for  suggestions on securing your monitor   Important Information About Sugar         Signed, Christell Constant, MD  09/07/2022 8:38 AM    [facility]

## 2022-09-07 ENCOUNTER — Encounter: Payer: Self-pay | Admitting: Internal Medicine

## 2022-09-07 ENCOUNTER — Ambulatory Visit: Payer: BC Managed Care – PPO | Attending: Internal Medicine

## 2022-09-07 ENCOUNTER — Ambulatory Visit: Payer: BC Managed Care – PPO | Attending: Internal Medicine | Admitting: Internal Medicine

## 2022-09-07 VITALS — BP 128/86 | HR 67 | Ht 66.0 in | Wt 256.0 lb

## 2022-09-07 DIAGNOSIS — I471 Supraventricular tachycardia, unspecified: Secondary | ICD-10-CM | POA: Diagnosis not present

## 2022-09-07 DIAGNOSIS — R002 Palpitations: Secondary | ICD-10-CM

## 2022-09-07 DIAGNOSIS — R0609 Other forms of dyspnea: Secondary | ICD-10-CM | POA: Diagnosis not present

## 2022-09-07 MED ORDER — DILTIAZEM HCL 30 MG PO TABS: 30.0000 mg | ORAL_TABLET | Freq: Four times a day (QID) | ORAL | 3 refills | Status: DC | PRN

## 2022-09-07 NOTE — Progress Notes (Unsigned)
Enrolled for [employer] to mail a ZIO XT long term holter monitor to the patients address on file.  

## 2022-09-07 NOTE — Patient Instructions (Signed)
Medication Instructions:  Your physician has recommended you make the following change in your medication:  START: diltiazem (Cardizem) 30 mg by mouth Every 6 hours as needed for Palpitations  *If you need a refill on your cardiac medications before your next appointment, please call your pharmacy*   Lab Work: NONE If you have labs (blood work) drawn today and your tests are completely normal, you will receive your results only by: [place] (if you have MyChart) OR A paper copy in the mail If you have any lab test that is abnormal or we need to change your treatment, we will call you to review the results.   Testing/Procedures: Your physician has requested that you have an echocardiogram. Echocardiography is a painless test that uses sound waves to create images of your heart. It provides your doctor with information about the size and shape of your heart and how well your heart's chambers and valves are working. This procedure takes approximately one hour. There are no restrictions for this procedure.  Your physician has requested that you wear a 14 day heart monitor.    Follow-Up: At [facility], you and your health needs are our priority.  As part of our continuing mission to provide you with exceptional heart care, we have created designated Provider Care Teams.  These Care Teams include your primary Cardiologist (physician) and Advanced Practice Providers (APPs -  Physician Assistants and Nurse Practitioners) who all work together to provide you with the care you need, when you need it.   Your next appointment:   3 month(s)  The format for your next appointment:   In Person  Provider:   Melina Copa, PA-C, Ambrose Pancoast, NP, or Ermalinda Barrios, PA-C         Other Instructions Bryn Gulling- Long Term Monitor Instructions  Your physician has requested you wear a ZIO patch monitor for 14 days.  This is a single patch monitor. [employer] supplies one patch monitor per  enrollment. Additional stickers are not available. Please do not apply patch if you will be having a Nuclear Stress Test,  Cardiac CT, MRI, or Chest Xray during the period you would be wearing the  monitor. The patch cannot be worn during these tests. You cannot remove and re-apply the  ZIO XT patch monitor.  Your ZIO patch monitor will be mailed 3 day USPS to your address on file. It may take 3-5 days  to receive your monitor after you have been enrolled.  Once you have received your monitor, please review the enclosed instructions. Your monitor  has already been registered assigning a specific monitor serial # to you.  Billing and Patient Assistance Program Information  We have supplied [employer] with any of your insurance information on file for billing purposes. [employer] offers a sliding scale Patient Assistance Program for patients that do not have  insurance, or whose insurance does not completely cover the cost of the ZIO monitor.  You must apply for the Patient Assistance Program to qualify for this discounted rate.  To apply, please call [employer] at [phone], select option 4, select option 2, ask to apply for  Patient Assistance Program. Theodore Demark will ask your household income, and how many people  are in your household. They will quote your out-of-pocket cost based on that information.  [employer] will also be able to set up a 47-month, interest-free payment plan if needed.  Applying the monitor   Shave hair from upper left chest.  Hold abrader disc  by orange tab. Rub abrader in 40 strokes over the upper left chest as  indicated in your monitor instructions.  Clean area with 4 enclosed alcohol pads. Let dry.  Apply patch as indicated in monitor instructions. Patch will be placed under collarbone on left  side of chest with arrow pointing upward.  Rub patch adhesive wings for 2 minutes. Remove white label marked "1". Remove the white  label marked "2". Rub patch adhesive wings for  2 additional minutes.  While looking in a mirror, press and release button in center of patch. A small green light will  flash 3-4 times. This will be your only indicator that the monitor has been turned on.  Do not shower for the first 24 hours. You may shower after the first 24 hours.  Press the button if you feel a symptom. You will hear a small click. Record Date, Time and  Symptom in the Patient Logbook.  When you are ready to remove the patch, follow instructions on the last 2 pages of Patient  Logbook. Stick patch monitor onto the last page of Patient Logbook.  Place Patient Logbook in the blue and white box. Use locking tab on box and tape box closed  securely. The blue and white box has prepaid postage on it. Please place it in the mailbox as  soon as possible. Your physician should have your test results approximately 7 days after the  monitor has been mailed back to [place].  Call [place] at [phone] if you have questions regarding  your ZIO XT patch monitor. Call them immediately if you see an orange light blinking on your  monitor.  If your monitor falls off in less than 4 days, contact our Monitor department at [phone].  If your monitor becomes loose or falls off after 4 days call [employer] at [phone] for  suggestions on securing your monitor   Important Information About Sugar

## 2022-09-10 DIAGNOSIS — R002 Palpitations: Secondary | ICD-10-CM

## 2022-09-10 DIAGNOSIS — I471 Supraventricular tachycardia, unspecified: Secondary | ICD-10-CM | POA: Diagnosis not present

## 2022-09-17 ENCOUNTER — Other Ambulatory Visit: Payer: Self-pay | Admitting: Internal Medicine

## 2022-09-17 DIAGNOSIS — R002 Palpitations: Secondary | ICD-10-CM

## 2022-09-28 ENCOUNTER — Ambulatory Visit (HOSPITAL_COMMUNITY): Payer: BC Managed Care – PPO | Attending: Internal Medicine

## 2022-09-28 DIAGNOSIS — R55 Syncope and collapse: Secondary | ICD-10-CM

## 2022-09-28 DIAGNOSIS — I471 Supraventricular tachycardia, unspecified: Secondary | ICD-10-CM | POA: Diagnosis present

## 2022-09-28 LAB — ECHOCARDIOGRAM COMPLETE
Area-P 1/2: 3.77 cm2
S' Lateral: 3.4 cm

## 2022-09-29 ENCOUNTER — Encounter: Payer: Self-pay | Admitting: Internal Medicine

## 2022-09-29 ENCOUNTER — Telehealth: Payer: Self-pay

## 2022-09-29 DIAGNOSIS — R0609 Other forms of dyspnea: Secondary | ICD-10-CM

## 2022-09-29 DIAGNOSIS — R002 Palpitations: Secondary | ICD-10-CM

## 2022-09-29 NOTE — Telephone Encounter (Signed)
Orders for losartan 12.5 mg PO QD, and prednisone pending pt call back.

## 2022-09-29 NOTE — Telephone Encounter (Signed)
-----   Message from Werner Lean, MD sent at 09/29/2022 11:00 AM EDT ----- Results: Mildly reduced ejection fraction ~ 50% Plan: Losartan 12.5 mg PO daily; BMP in one week; CCTA with pretreatment  Werner Lean, MD

## 2022-09-29 NOTE — Telephone Encounter (Signed)
Called pt to review MD recommendation for Cardiac CT.  Instruction letter pending; need to verbally review with pt will then release to my chart. Pt will need BMP order placed need to schedule.

## 2022-10-01 MED ORDER — LOSARTAN POTASSIUM 25 MG PO TABS: 12.5000 mg | ORAL_TABLET | Freq: Every day | ORAL | 3 refills | Status: DC

## 2022-10-01 NOTE — Telephone Encounter (Signed)
Spoke with MD pt will not have Cardiac CT.  Letter deleted placed order for losartan 12.5 mg.  Sent pt a my chart message with updated information.  Asked that pt send in my chart message of day for OV prior to 10/15/22.

## 2022-10-05 ENCOUNTER — Ambulatory Visit: Payer: BC Managed Care – PPO | Attending: Internal Medicine | Admitting: Internal Medicine

## 2022-10-05 ENCOUNTER — Encounter: Payer: Self-pay | Admitting: Internal Medicine

## 2022-10-05 VITALS — BP 140/96 | HR 80 | Ht 66.0 in | Wt 248.0 lb

## 2022-10-05 DIAGNOSIS — I493 Ventricular premature depolarization: Secondary | ICD-10-CM | POA: Diagnosis not present

## 2022-10-05 DIAGNOSIS — I509 Heart failure, unspecified: Secondary | ICD-10-CM | POA: Diagnosis not present

## 2022-10-05 MED ORDER — CARVEDILOL 6.25 MG PO TABS
6.2500 mg | ORAL_TABLET | Freq: Two times a day (BID) | ORAL | 3 refills | Status: DC
Start: 2022-10-05 — End: 2022-10-16

## 2022-10-05 NOTE — Progress Notes (Signed)
Cardiology Office Note:    Date:  10/05/2022   ID:  DONNICA JARNAGIN, DOB 15-Jul-1966, MRN [id number]  PCP:  Ailene Ravel, MD   [facility] Providers Cardiologist:  Christell Constant, MD     Referring MD: Ailene Ravel, MD   CC: Follow up   History of Present Illness:    Julie Hampton is a 56 y.o. female with a hx of chest pain, palpitations, and near syncope presenting to establish care.  Has history of SVT. 2023: did echo with LVEF 45% and PVCs with.   Patient notes that she is doing about the same.  Has not started ARB or BB yet. Has one spell of palpitations that was caught on monitor- isolated PVCs. Wonders if her sx are related to prior COVID-19  No chest pain or pressure .  No SOB and no PND/Orthopnea.  No weight gain or leg swelling.      Past Medical History:  Diagnosis Date   Chest pain    Leg swelling    Near syncope    Obesity    Palpitations    Seizures (HCC)     Past Surgical History:  Procedure Laterality Date   APPENDECTOMY  01/2011   COLONOSCOPY     removed pre-cancerous polyps   KNEE SURGERY Right 12/08/2016    Current Medications: Current Meds  Medication Sig   albuterol (VENTOLIN HFA) 108 (90 Base) MCG/ACT inhaler Inhale 2 puffs into the lungs every 6 (six) hours as needed for wheezing or shortness of breath.   carvedilol (COREG) 6.25 MG tablet Take 1 tablet (6.25 mg total) by mouth 2 (two) times daily.   diltiazem (CARDIZEM) 30 MG tablet Take 1 tablet (30 mg total) by mouth every 6 (six) hours as needed (palpitations).   EPINEPHrine (EPIPEN 2-PAK) 0.3 mg/0.3 mL IJ SOAJ injection Inject 0.3 mg into the muscle as needed for anaphylaxis.   levETIRAcetam (KEPPRA XR) 500 MG 24 hr tablet Take 2,500 mg by mouth at bedtime.   [DISCONTINUED] losartan (COZAAR) 25 MG tablet Take 0.5 tablets (12.5 mg total) by mouth daily.     Allergies:   Iodinated contrast media, Penicillins, Thallous chloride tl 201, Morphine and related, Other,  Alpha-gal, Beef (bovine) protein, Latex, Strawberry extract, and Thallium   Social History   Socioeconomic History   Marital status: Married    Spouse name: Not on file   Number of children: Not on file   Years of education: Not on file   Highest education level: Not on file  Occupational History   Not on file  Tobacco Use   Smoking status: Former   Smokeless tobacco: Never   Tobacco comments:    Quit- 1995  Substance and Sexual Activity   Alcohol use: No    Comment: Quit in 1989   Drug use: No   Sexual activity: Not on file  Other Topics Concern   Not on file  Social History Narrative   Patient is married and lives with her husband and son.   Patient is a Runner, broadcasting/film/video in [place].   Patient is right-handed.   Patient drinks one cup of coffee daily.   Social Determinants of Health   Financial Resource Strain: Not on file  Food Insecurity: Not on file  Transportation Needs: Not on file  Physical Activity: Not on file  Stress: Not on file  Social Connections: Not on file    Social: assistant principal married, husband  Family History: The patient's  family history includes Breast cancer in an other family member; Colon cancer in her mother; Epilepsy in her sister; Heart failure in her father; Skin cancer in her sister.  ROS:   Please see the history of present illness.     All other systems reviewed and are negative.  EKGs/Labs/Other Studies Reviewed:    The following studies were reviewed today:  EKG:   10/05/22: SR with LVH 08/10/22: NSR 90  Cardiac Studies & Procedures       ECHOCARDIOGRAM  ECHOCARDIOGRAM COMPLETE 09/28/2022  Narrative ECHOCARDIOGRAM REPORT    Patient Name:   Julie Hampton  Date of Exam: 09/28/2022 Medical Rec #:  [id number]     Height:       66.0 in Accession #:    [id number]    Weight:       256.0 lb Date of Birth:  [birth year]     BSA:          2.221 m Patient Age:    47 years      BP:           128/86 mmHg Patient Gender: F              HR:           72 bpm. Exam Location:  [place]  Procedure: 2D Echo, Cardiac Doppler and Color Doppler  Indications:    I47.1 SVT  History:        Patient has no prior history of Echocardiogram examinations. Signs/Symptoms:Chest Pain. Palpitations. Near syncope.  Sonographer:    Cresenciano Lick RDCS Referring Phys: [id number] [facility] Grimaldo  IMPRESSIONS   1. Left ventricular ejection fraction, by estimation, is 45 to 50%. The left ventricle has mildly decreased function. The left ventricle demonstrates global hypokinesis. Left ventricular diastolic parameters are consistent with Grade I diastolic dysfunction (impaired relaxation). 2. Right ventricular systolic function is mildly reduced. The right ventricular size is normal. Tricuspid regurgitation signal is inadequate for assessing PA pressure. 3. The mitral valve is normal in structure. Trivial mitral valve regurgitation. No evidence of mitral stenosis. 4. The aortic valve is tricuspid. Aortic valve regurgitation is trivial. No aortic stenosis is present.  FINDINGS Left Ventricle: Left ventricular ejection fraction, by estimation, is 45 to 50%. The left ventricle has mildly decreased function. The left ventricle demonstrates global hypokinesis. The left ventricular internal cavity size was normal in size. There is no left ventricular hypertrophy. Left ventricular diastolic parameters are consistent with Grade I diastolic dysfunction (impaired relaxation).  Right Ventricle: The right ventricular size is normal. Right vetricular wall thickness was not well visualized. Right ventricular systolic function is mildly reduced. Tricuspid regurgitation signal is inadequate for assessing PA pressure.  Left Atrium: Left atrial size was normal in size.  Right Atrium: Right atrial size was normal in size.  Pericardium: There is no evidence of pericardial effusion.  Mitral Valve: The mitral valve is normal in structure.  Trivial mitral valve regurgitation. No evidence of mitral valve stenosis.  Tricuspid Valve: The tricuspid valve is normal in structure. Tricuspid valve regurgitation is trivial.  Aortic Valve: The aortic valve is tricuspid. Aortic valve regurgitation is trivial. No aortic stenosis is present.  Pulmonic Valve: The pulmonic valve was not well visualized. Pulmonic valve regurgitation is not visualized.  Aorta: The aortic root is normal in size and structure.  IAS/Shunts: The interatrial septum was not well visualized.   LEFT VENTRICLE PLAX 2D LVIDd:         4.80 cm  Diastology LVIDs:         3.40 cm   LV e' medial:    4.13 cm/s LV PW:         0.90 cm   LV E/e' medial:  12.2 LV IVS:        0.90 cm   LV e' lateral:   8.92 cm/s LVOT diam:     2.10 cm   LV E/e' lateral: 5.6 LV SV:         53 LV SV Index:   24 LVOT Area:     3.46 cm   RIGHT VENTRICLE RV Basal diam:  3.10 cm RV S prime:     10.93 cm/s TAPSE (M-mode): 2.0 cm  LEFT ATRIUM             Index        RIGHT ATRIUM           Index LA diam:        4.10 cm 1.85 cm/m   RA Area:     12.90 cm LA Vol (A2C):   33.0 ml 14.86 ml/m  RA Volume:   34.20 ml  15.40 ml/m LA Vol (A4C):   32.2 ml 14.50 ml/m LA Biplane Vol: 33.2 ml 14.95 ml/m AORTIC VALVE LVOT Vmax:   72.35 cm/s LVOT Vmean:  49.300 cm/s LVOT VTI:    0.153 m  AORTA Ao Root diam: 3.30 cm Ao Asc diam:  3.70 cm  MITRAL VALVE MV Area (PHT): 3.77 cm    SHUNTS MV Decel Time: 201 msec    Systemic VTI:  0.15 m MV E velocity: 50.30 cm/s  Systemic Diam: 2.10 cm MV A velocity: 86.75 cm/s MV E/A ratio:  0.58  Epifanio Lesches MD Electronically signed by Epifanio Lesches MD Signature Date/Time: 09/28/2022/11:25:13 AM    Final    MONITORS  LONG TERM MONITOR (3-14 DAYS) 10/04/2022  Narrative   Patient had a minimum heart rate of 50 bpm, maximum heart rate of 169 bpm, and average heart rate of 92 bpm.   Predominant underlying rhythm was sinus rhythm.    Two runs of NSVT longest lasting 10 beats.   Isolated PACs were rare (<1.0%).   Isolated PVCs were rare (<1.0%).   Triggered and diary events associated with sinus rhythm, PVCs, and PACs.  Rarely symptomatic PVCs and PACs.            Recent Labs: 08/10/2022: B Natriuretic Peptide 5.6; BUN 13; Creatinine, Ser 0.78; Hemoglobin 14.6; Platelets 244; Potassium 4.1; Sodium 142  Recent Lipid Panel    Component Value Date/Time   TRIG 151 (H) 09/05/2019 1715    Physical Exam:    VS:  BP (!) 140/96   Pulse 80   Ht 5\' 6"  (1.676 m)   Wt 248 lb (112.5 kg)   SpO2 96%   BMI 40.03 kg/m     Wt Readings from Last 3 Encounters:  10/05/22 248 lb (112.5 kg)  09/07/22 256 lb (116.1 kg)  09/05/19 235 lb 14.3 oz (107 kg)    Gen: No distress, morbid obesity   Neck: No JVD Cardiac: No Rubs or Gallops, no murmur, RRR +2 radial pulses Respiratory: Clear to auscultation bilaterally, normal effort, normal  respiratory rate GI: Soft, nontender, non-distended  MS: No  edema;  moves all extremities Integument: Skin feels warm Neuro:  At time of evaluation, alert and oriented to person/place/time/situation  Psych: Normal affect, patient feels well presently   ASSESSMENT:    1. Heart failure,  type unknown (HCC)   2. PVC's (premature ventricular contractions)   3. 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  Concerns regarding medicines are  outlined above.  Orders Placed This Encounter  Procedures   AMB Referral to [facility] Pharm-D   EKG 12-Lead   Meds ordered this encounter  Medications   carvedilol (COREG) 6.25 MG tablet    Sig: Take 1 tablet (6.25 mg total) by mouth 2 (two) times daily.    Dispense:  180 tablet    Refill:  3    Patient Instructions  Medication Instructions:  Your physician has recommended you make the following change in your medication:  START: carvedilol (Coreg) 6.25 mg by mouth twice daily   *If you need a refill on your cardiac medications before your next appointment, please call your pharmacy*   Lab Work: NONE If you have labs (blood work) drawn today and your tests are completely normal, you will receive your results only by: MyChart Message (if you have MyChart) OR A paper copy in the mail If you have any lab test that is abnormal or we need to change your treatment, we will call you to review the results.   Testing/Procedures: Your physician has referred you to see Pharmacy Clinic for Heart Failure after Nov surgery.   Follow-Up: As scheduled At [facility], you and your health needs are our priority.  As part of our continuing mission to provide you with exceptional heart care, we have created designated Provider Care Teams.  These Care Teams include your primary Cardiologist (physician) and Advanced Practice Providers (APPs -  Physician Assistants and Nurse Practitioners) who all work together to provide you with the care you need, when you need it.     Important Information About Sugar         Signed, Christell Constant, MD  10/05/2022 9:27 AM    [facility]

## 2022-10-05 NOTE — Patient Instructions (Addendum)
Medication Instructions:  Your physician has recommended you make the following change in your medication:  START: carvedilol (Coreg) 6.25 mg by mouth twice daily STOP: losartan   *If you need a refill on your cardiac medications before your next appointment, please call your pharmacy*   Lab Work: NONE If you have labs (blood work) drawn today and your tests are completely normal, you will receive your results only by: [place] (if you have MyChart) OR A paper copy in the mail If you have any lab test that is abnormal or we need to change your treatment, we will call you to review the results.   Testing/Procedures: Your physician has referred you to see Pharmacy Clinic for Heart Failure after Nov surgery.   Follow-Up: As scheduled At [facility], you and your health needs are our priority.  As part of our continuing mission to provide you with exceptional heart care, we have created designated Provider Care Teams.  These Care Teams include your primary Cardiologist (physician) and Advanced Practice Providers (APPs -  Physician Assistants and Nurse Practitioners) who all work together to provide you with the care you need, when you need it.     Important Information About Sugar

## 2022-10-07 HISTORY — PX: KNEE SURGERY: SHX244

## 2022-10-16 MED ORDER — CARVEDILOL 3.125 MG PO TABS: 3.1250 mg | ORAL_TABLET | Freq: Two times a day (BID) | ORAL | 11 refills | Status: DC

## 2022-10-16 NOTE — Telephone Encounter (Signed)
Called pt reports just returned home after having Knee surgery.  Is concerned that HR was down to 47 during surgery.  Last BP reported was 117/64.  Advised pt to decrease carvedilol to 3.125 mg PO BID script sent into pharmacy.  If BP is below 100 pt to stop taking med. Pt expresses understanding and will reach out to our office if needed.

## 2022-11-16 ENCOUNTER — Ambulatory Visit: Payer: BC Managed Care – PPO

## 2022-12-02 NOTE — Progress Notes (Unsigned)
Office Visit    Patient Name: Julie Hampton Date of Encounter: 12/02/2022  Primary Care Provider:  Ailene Ravel, MD Primary Cardiologist:  Julie Constant, MD Primary Electrophysiologist: None  Chief Complaint    Julie Hampton is a 56 y.o. female with PMH of HFmrEF, SVT, obesity, palpitations, near-syncope who presents today for 82-month follow-up of CHF.  Past Medical History    Past Medical History:  Diagnosis Date   Chest pain    Leg swelling    Near syncope    Obesity    Palpitations    Seizures (HCC)    Past Surgical History:  Procedure Laterality Date   APPENDECTOMY  01/2011   COLONOSCOPY     removed pre-cancerous polyps   KNEE SURGERY Right 12/08/2016    Allergies  Allergies  Allergen Reactions   Iodinated Contrast Media Hives and Shortness Of Breath   Penicillins Anaphylaxis    Did it involve swelling of the face/tongue/throat, SOB, or low BP? Yes Did it involve sudden or severe rash/hives, skin peeling, or any reaction on the inside of your mouth or nose? Yes Did you need to seek medical attention at a hospital or doctor's office? Yes When did it last happen?   Over 20 Years Ago    If all above answers are "NO", may proceed with cephalosporin use.     Thallous Chloride Tl 201 Hives    Contrast dye for cardiac stress test.  Had increased heart rate, hives.   Morphine And Related Other (See Comments)    L leg (paralysis). Lasted 18-24 hours.   L leg (paralysis). Lasted 18-24 hours.     Other     Banana peppers   Alpha-Gal Hives and Palpitations    Other reaction(s): Wheezing   Beef (Bovine) Protein Rash   Latex Dermatitis    Other reaction(s): Not available   Strawberry Extract Rash    Other reaction(s): Not available   Thallium Hives    Contrast dye for cardiac stress test.  Had increased heart rate, hives.    History of Present Illness    Julie Hampton  is a 56 year old female with the above mention past medical history who  presents today for 109-month follow-up of CHF.  [name] was initially seen by Dr. Izora Hampton to establish care on 09/2022 following ED visit for complaint of chest pain and palpitations.  She had no previous history of coronary disease and underwent LHC in 2015 that showed no evidence of obstruction.  She also endorsed intermittent episodes of palpitations.  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  She was started on Cardizem as needed and 14-day ZIO monitor was completed that revealed PVCs and PACs.  2D echo was completed that revealed mildly reduced EF of 45-50% with grade 1 DD and global hypokinesis.  She was started on carvedilol and will be followed by Pharm.D. for further titrations following the surgery.  Plan for patient to have CMR complete if unable to titrate medications further.  Since last being seen in the office patient reports***.  Patient denies chest pain, palpitations, dyspnea, PND, orthopnea, nausea, vomiting, dizziness, syncope, edema, weight gain, or early satiety.   ***Notes:  Home Medications    Current Outpatient Medications  Medication Sig Dispense Refill   albuterol (VENTOLIN HFA) 108 (90 Base) MCG/ACT inhaler Inhale 2 puffs into the lungs every 6 (six) hours as needed for wheezing or shortness of breath.  carvedilol (COREG) 3.125 MG tablet Take 1 tablet (3.125 mg total) by mouth 2 (two) times daily. 60 tablet 11   diltiazem (CARDIZEM) 30 MG tablet Take 1 tablet (30 mg total) by mouth every 6 (six) hours as needed (palpitations). 360 tablet 3   EPINEPHrine (EPIPEN 2-PAK) 0.3 mg/0.3 mL IJ SOAJ injection Inject 0.3 mg into the muscle as needed for anaphylaxis.     levETIRAcetam (KEPPRA XR) 500 MG 24 hr tablet Take 2,500 mg by mouth at bedtime.     No current facility-administered medications for this visit.     Review of Systems  Please see the history of present illness.    (+)*** (+)***  All other systems reviewed and are otherwise negative except as  noted above.  Physical Exam    Wt Readings from Last 3 Encounters:  10/05/22 248 lb (112.5 kg)  09/07/22 256 lb (116.1 kg)  09/05/19 235 lb 14.3 oz (107 kg)   RU:EAVWU were no vitals filed for this visit.,There is no height or weight on file to calculate BMI.  Constitutional:      Appearance: Healthy appearance. Not in distress.  Neck:     Vascular: JVD normal.  Pulmonary:     Effort: Pulmonary effort is normal.     Breath sounds: No wheezing. No rales. Diminished in the bases Cardiovascular:     Normal rate. Regular rhythm. Normal S1. Normal S2.      Murmurs: There is no murmur.  Edema:    Peripheral edema absent.  Abdominal:     Palpations: Abdomen is soft non tender. There is no hepatomegaly.  Skin:    General: Skin is warm and dry.  Neurological:     General: No focal deficit present.     Mental Status: Alert and oriented to person, place and time.     Cranial Nerves: Cranial nerves are intact.  EKG/LABS/Other Studies Reviewed    ECG personally reviewed by me today - ***  Risk Assessment/Calculations:   {Does this patient have ATRIAL FIBRILLATION?:[id number]}        Lab Results  Component Value Date   WBC 6.6 08/10/2022   HGB 14.6 08/10/2022   HCT 43.2 08/10/2022   MCV 92.1 08/10/2022   PLT 244 08/10/2022   Lab Results  Component Value Date   CREATININE 0.78 08/10/2022   BUN 13 08/10/2022   NA 142 08/10/2022   K 4.1 08/10/2022   CL 108 08/10/2022   CO2 22 08/10/2022   Lab Results  Component Value Date   ALT 30 09/05/2019   AST 22 09/05/2019   ALKPHOS 59 09/05/2019   BILITOT 0.4 09/05/2019   Lab Results  Component Value Date   TRIG 151 (H) 09/05/2019    No results found for: "HGBA1C"  Assessment & Plan    1.HFmrEF: -2D echo completed 10/23 with EF of 45-50% -Today patient reports*** -Patient is currently on carvedilol 3.125 mg twice daily with plan to titrate or add ARNI  2.  Dyspnea on exertion:   3.  Palpitations  4.***       Disposition: Follow-up with Julie Constant, MD or APP in *** months {Are you ordering a CV Procedure (e.g. stress test, cath, DCCV, TEE, etc)?   Press F2        :[id number]}   Medication Adjustments/Labs and Tests Ordered: Current medicines are reviewed at length with the patient today.  Concerns regarding medicines are outlined above.   Signed, Napoleon Form, Leodis Rains, NP 12/02/2022, 7:25 AM  [facility]  Note:  This document was prepared using Dragon voice recognition software and may include unintentional dictation errors.

## 2022-12-03 ENCOUNTER — Ambulatory Visit: Payer: BC Managed Care – PPO | Attending: Nurse Practitioner | Admitting: Nurse Practitioner

## 2022-12-03 ENCOUNTER — Encounter: Payer: Self-pay | Admitting: Nurse Practitioner

## 2022-12-03 VITALS — BP 122/84 | HR 88 | Ht 66.0 in | Wt 261.2 lb

## 2022-12-03 DIAGNOSIS — R0609 Other forms of dyspnea: Secondary | ICD-10-CM

## 2022-12-03 DIAGNOSIS — I5022 Chronic systolic (congestive) heart failure: Secondary | ICD-10-CM | POA: Diagnosis not present

## 2022-12-03 DIAGNOSIS — R002 Palpitations: Secondary | ICD-10-CM

## 2022-12-03 NOTE — Patient Instructions (Signed)
Medication Instructions:   . Your physician recommends that you continue on your current medications as directed. Please refer to the Current Medication list given to you today.   *If you need a refill on your cardiac medications before your next appointment, please call your pharmacy*   Lab Work: NONE ORDERED  TODAY    If you have labs (blood work) drawn today and your tests are completely normal, you will receive your results only by: MyChart Message (if you have MyChart) OR A paper copy in the mail If you have any lab test that is abnormal or we need to change your treatment, we will call you to review the results.   Testing/Procedures: NONE ORDERED  TODAY    Follow-Up: At [facility], you and your health needs are our priority.  As part of our continuing mission to provide you with exceptional heart care, we have created designated Provider Care Teams.  These Care Teams include your primary Cardiologist (physician) and Advanced Practice Providers (APPs -  Physician Assistants and Nurse Practitioners) who all work together to provide you with the care you need, when you need it.  We recommend signing up for the patient portal called "MyChart".  Sign up information is provided on this After Visit Summary.  MyChart is used to connect with patients for Virtual Visits (Telemedicine).  Patients are able to view lab/test results, encounter notes, upcoming appointments, etc.  Non-urgent messages can be sent to your provider as well.   To learn more about what you can do with MyChart, go to [email or web address].com.    Your next appointment:   3 month(s)  The format for your next appointment:   In Person  Provider:   Ernest Dick, NP     Other Instructions   Important Information About Sugar       

## 2022-12-09 ENCOUNTER — Other Ambulatory Visit: Payer: Self-pay

## 2022-12-09 MED ORDER — LEVETIRACETAM ER 500 MG PO TB24: ORAL_TABLET | ORAL | 0 refills | Status: DC

## 2022-12-17 ENCOUNTER — Ambulatory Visit: Payer: BC Managed Care – PPO | Attending: Cardiology | Admitting: Student

## 2022-12-17 VITALS — BP 122/70 | HR 77

## 2022-12-17 DIAGNOSIS — I509 Heart failure, unspecified: Secondary | ICD-10-CM

## 2022-12-17 MED ORDER — METOPROLOL SUCCINATE ER 25 MG PO TB24: 25.0000 mg | ORAL_TABLET | Freq: Every day | ORAL | 10 refills | Status: DC

## 2022-12-17 NOTE — Progress Notes (Unsigned)
Patient ID: Julie Hampton                 DOB: [birth year]                      MRN: [id number]     HPI: Julie Hampton is a 57 y.o. female referred by Dr. Gasper Sells to pharmacy clinic for HF medication management. PMH is significant for HFmrEF, SVT, obesity, palpitations,seizure disorder. Most recent LVEF 45-50% on 10/23.  Patient presented with husband today. Patient is still recovering from knee surgery that she had in November, 2023. She is going to PT session twice a week. 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 States she had gained several pounds since 2017 due knee injuries and surgery as her mobility got limited. They try to eat healthy. They eat eat 5-6 meals per week. She tolerates carvedilol low dose well without any side effects. Patient is unsure about her diagnosis and has lots of questions about the HF medications. In the past when she was in the hospital her heart rate was getting too low when she was on carvedilol 6.25 mg so they had to lower the dose to 3.125 mg twice daily  Denies any dizziness, SOB, lightheadedness, fatigue or palpitation. She has been prescribed diltiazem PRN dose for palpitation episodes and she did not need to use it in last few months. She checks her BP at home and it averages to 125-130/75-80 range with heart rate 77-90 range. Her BP cuff has not been validated. It is 55 years old meter. Her weight is stable and denies any change in appetite and denies any swelling.     Current CHF meds: Cravedilol 3.125 mg twice daily  Previously tried: carvedilol 6.25 mg twice daily - bradycardia  Adherence Assessment  Do you ever forget to take your medication? [] Yes [x] No  Do you ever skip doses due to side effects? [] Yes [x] No  Do you have trouble affording your medicines? [] Yes [x] No  Are you ever unable to pick up your medication due to transportation difficulties? [] Yes [x] No  Do you  ever stop taking your medications because you don't believe they are helping? [] Yes [x] No  Do you check your weight daily?  [x] Yes [] No   Adherence strategy: husband reminds her to take medications   Barriers to obtaining medications: none   BP goal: <130/80 Diet:  Breakfast: egg, cheddar cheese with wheat wrap, special K protein cereals  Lunch: grilled chicken salad Dinner: chicken and vegetables  Eat out 5-6 meals per week   Exercise: none due to chronic knee problem (since 2017)   Home BP readings: ~124-130/75-80 with heart rate 77-90  Wt Readings from Last 3 Encounters:  12/03/22 261 lb 3.2 oz (118.5 kg)  10/05/22 248 lb (112.5 kg)  09/07/22 256 lb (116.1 kg)   BP Readings from Last 3 Encounters:  12/17/22 122/70  12/03/22 122/84  10/05/22 (!) 140/96   Pulse Readings from Last 3 Encounters:  12/17/22 77  12/03/22 88  10/05/22 80    Renal function: CrCl cannot be calculated (Patient's most recent lab result is older than the maximum 21 days allowed.).  Past Medical History:  Diagnosis Date   Chest pain    Leg swelling    Near syncope    Obesity    Palpitations    Seizures (HCC)     Current Outpatient Medications on File  Prior to Visit  Medication Sig Dispense Refill   albuterol (VENTOLIN HFA) 108 (90 Base) MCG/ACT inhaler Inhale 2 puffs into the lungs every 6 (six) hours as needed for wheezing or shortness of breath.     diltiazem (CARDIZEM) 30 MG tablet Take 1 tablet (30 mg total) by mouth every 6 (six) hours as needed (palpitations). 360 tablet 3   EPINEPHrine (EPIPEN 2-PAK) 0.3 mg/0.3 mL IJ SOAJ injection Inject 0.3 mg into the muscle as needed for anaphylaxis.     levETIRAcetam (KEPPRA XR) 500 MG 24 hr tablet Take 2 Tablets By Mouth In The Morning, 3 Tablets In The Evening (About 12 Hours Apart) 150 tablet 0   No current facility-administered medications on file prior to visit.    Allergies  Allergen Reactions   Iodinated Contrast Media Hives and  Shortness Of Breath   Penicillins Anaphylaxis    Did it involve swelling of the face/tongue/throat, SOB, or low BP? Yes Did it involve sudden or severe rash/hives, skin peeling, or any reaction on the inside of your mouth or nose? Yes Did you need to seek medical attention at a hospital or doctor's office? Yes When did it last happen?   Over 20 Years Ago    If all above answers are "NO", may proceed with cephalosporin use.     Thallous Chloride Tl 201 Hives    Contrast dye for cardiac stress test.  Had increased heart rate, hives.   Morphine And Related Other (See Comments)    L leg (paralysis). Lasted 18-24 hours.   L leg (paralysis). Lasted 18-24 hours.     Other     Banana peppers   Alpha-Gal Hives and Palpitations    Other reaction(s): Wheezing   Beef (Bovine) Protein Rash   Latex Dermatitis    Other reaction(s): Not available   Strawberry Extract Rash    Other reaction(s): Not available   Thallium Hives    Contrast dye for cardiac stress test.  Had increased heart rate, hives.    Heart failure, type unknown ([place]) Overview: EF history:  No components found for: "EJECFRACECHO"   Admission history: chest pain (08/16/2015) GDMT:  ACEi/ARB/ARNi -  MRA -  Beta blocker - carvedilol 3.125 mg twice daily  SGLT2i -  Dry weight: Weight as of 12/17/2022 was 261 lbs and patient was euvolemic  Diuretic use:  none    Assessment & Plan: Assessment/Plan:  Patient last EF 45-50% -09/2022. In office BP 122/70 with heart rate 77 Reports home BP ~ 125-130/75-80 and heart rate ~77-90 range  Currently only on betablocker - tolerating it well without side effects Patient is unsure about the diagnosis and want to know more about the condition from Dr.Chandrasekhar at the next OV  Given soft BP and high heart rate switched carvedilol to metoprolol succinate; will give some room to initiate ARNI without causing hypotension  In future can consider adding SGLT2i due to less effect on BP         Other orders -     Metoprolol Succinate ER; Take 1 tablet (25 mg total) by mouth daily. Stop taking carvedilol 3.125  Dispense: 30 tablet; Refill: 10    Thank you   Cammy Copa, Pharm.D [facility] [address] [place], [place] [address]  Phone: [phone]; Fax: [phone]

## 2022-12-17 NOTE — Patient Instructions (Signed)
Changes made by your pharmacist Cammy Copa, PharmD at today's visit:    Instructions/Changes  (what do you need to do) Your Notes  (what you did and when you did it)  Stop taking carvedilol from tomorrow and start taking metoprolol XL 25 mg once daily    Check your BP at home ,bring log at the next office visit and bring monitor for accuracy check   Follow low salt diet       If you have any questions or concerns please use My Chart to send questions or call the office at [phone]

## 2022-12-18 NOTE — Assessment & Plan Note (Signed)
Assessment/Plan:  Patient last EF 45-50% -09/2022. In office BP 122/70 with heart rate 77 Reports home BP ~ 125-130/75-80 and heart rate ~77-90 range  Currently only on betablocker - tolerating it well without side effects Patient is unsure about the diagnosis and want to know more about the condition from Dr.Chandrasekhar at the next OV  Given soft BP and high heart rate switched carvedilol to metoprolol succinate; will give some room to initiate ARNI without causing hypotension  In future can consider adding SGLT2i due to less effect on BP

## 2022-12-19 ENCOUNTER — Other Ambulatory Visit: Payer: Self-pay | Admitting: Family Medicine

## 2023-01-09 ENCOUNTER — Encounter: Payer: Self-pay | Admitting: Internal Medicine

## 2023-01-19 ENCOUNTER — Other Ambulatory Visit: Payer: Self-pay | Admitting: Family Medicine

## 2023-01-19 ENCOUNTER — Telehealth: Payer: Self-pay | Admitting: Family Medicine

## 2023-01-19 MED ORDER — LEVETIRACETAM ER 500 MG PO TB24: ORAL_TABLET | ORAL | 0 refills | Status: DC

## 2023-01-19 NOTE — Telephone Encounter (Signed)
Pt has scheduled her 1 yr f/u for 2/15, she states she does not have any levETIRAcetam (KEPPRA XR) 500 MG 24 hr tablet  for tonight.  Pt asking that [facility] be called [id number]

## 2023-01-19 NOTE — Patient Instructions (Incomplete)
Below is our plan:  We will continue levetiracetam XR 2514m daily at bedtime .   Please make sure you are consistent with timing of seizure medication. I recommend annual visit with primary care provider (PCP) for complete physical and routine blood work. I recommend daily intake of vitamin D (400-800iu) and calcium (800-1007m for bone health. Discuss Dexa screening with PCP.   According to [place], you can not drive unless you are seizure / syncope free for at least 6 months and under physician's care.  Please maintain precautions. Do not participate in activities where a loss of awareness could harm you or someone else. No swimming alone, no tub bathing, no hot tubs, no driving, no operating motorized vehicles (cars, ATVs, motocycles, etc), lawnmowers, power tools or firearms. No standing at heights, such as rooftops, ladders or stairs. Avoid hot objects such as stoves, heaters, open fires. Wear a helmet when riding a bicycle, scooter, skateboard, etc. and avoid areas of traffic. Set your water heater to 120 degrees or less.  Please make sure you are staying well hydrated. I recommend 50-60 ounces daily. Well balanced diet and regular exercise encouraged. Consistent sleep schedule with 6-8 hours recommended.   Please continue follow up with care team as directed.   Follow up with me in 1 year   You may receive a survey regarding today's visit. I encourage you to leave honest feed back as I do use this information to improve patient care. Thank you for seeing me today!

## 2023-01-19 NOTE — Telephone Encounter (Signed)
Pt follow up scheduled on 01/21/23

## 2023-01-19 NOTE — Telephone Encounter (Signed)
Pt is calling. Asking why her refill was denied. Pt is requesting a call back from nurse. I informed pt she also needed her yearly appointment. Pt stated she need to get this medication first because she is almost out.

## 2023-01-19 NOTE — Progress Notes (Unsigned)
No chief complaint on file.   HISTORY OF PRESENT ILLNESS:  01/19/23 ALL:  Julie Hampton is a 57 y.o. female here today for follow up for seizures. She continues levetiracetam XR 1070m in am and 15065min evenings.   12/31/21 ALL ([place]ychart): Julie SIEGELs a [age].o. female here today for follow up for seizure disorder. She continues to do well on levetiracetam 100046mn the am and 1500m93m the pm. No seizures, last seizure 2008. She is tolerating medication well. She is followed by PCP annually. Labs are reportedly normal. She is feeling well and without complaints.    01/06/21 ALL (Mychart): She returns for seizure follow up. She continues 1000/1500. Last seizure 2008. She is doing very well. She denies any changes in medical history or concerns today. She did have Covid last year and was fairly sick for about 2 months. Fortunately, she has fully recovered. She is vaccinated.    10/03/2019 ALL (Mychart):  Julie DICKELa 54 y25. female here today for follow up for seizure. She continues Keppra XR 1000mg17mam and 1500mg 67mm. Last seizure in 2008.  She is doing very well on Keppra with no obvious adverse effects.  She was recently diagnosed with COVID-19.  She is recovering well.  She is seen regularly by her PCP.  She is working full-time as a teachePharmacist, hospitalently working remotely.  She is able to drive and perform all ADLs.   REVIEW OF SYSTEMS: Out of a complete 14 system review of symptoms, the patient complains only of the following symptoms, and all other reviewed systems are negative.   ALLERGIES: Allergies  Allergen Reactions   Iodinated Contrast Media Hives and Shortness Of Breath   Penicillins Anaphylaxis    Did it involve swelling of the face/tongue/throat, SOB, or low BP? Yes Did it involve sudden or severe rash/hives, skin peeling, or any reaction on the inside of your mouth or nose? Yes Did you need to seek medical attention at a hospital or doctor's office? Yes When  did it last happen?   Over 20 Years Ago    If all above answers are "NO", may proceed with cephalosporin use.     Thallous Chloride Tl 201 Hives    Contrast dye for cardiac stress test.  Had increased heart rate, hives.   Morphine And Related Other (See Comments)    L leg (paralysis). Lasted 18-24 hours.   L leg (paralysis). Lasted 18-24 hours.     Other     Banana peppers   Alpha-Gal Hives and Palpitations    Other reaction(s): Wheezing   Beef (Bovine) Protein Rash   Latex Dermatitis    Other reaction(s): Not available   Strawberry Extract Rash    Other reaction(s): Not available   Thallium Hives    Contrast dye for cardiac stress test.  Had increased heart rate, hives.     HOME MEDICATIONS: Outpatient Medications Prior to Visit  Medication Sig Dispense Refill   albuterol (VENTOLIN HFA) 108 (90 Base) MCG/ACT inhaler Inhale 2 puffs into the lungs every 6 (six) hours as needed for wheezing or shortness of breath.     diltiazem (CARDIZEM) 30 MG tablet Take 1 tablet (30 mg total) by mouth every 6 (six) hours as needed (palpitations). 360 tablet 3   EPINEPHrine (EPIPEN 2-PAK) 0.3 mg/0.3 mL IJ SOAJ injection Inject 0.3 mg into the muscle as needed for anaphylaxis.     levETIRAcetam (KEPPRA XR) 500 MG  24 hr tablet Take 2 Tablets By Mouth In The Morning, 3 Tablets In The Evening (About 12 Hours Apart) 150 tablet 0   metoprolol succinate (TOPROL XL) 25 MG 24 hr tablet Take 1 tablet (25 mg total) by mouth daily. Stop taking carvedilol 3.125 30 tablet 10   No facility-administered medications prior to visit.     PAST MEDICAL HISTORY: Past Medical History:  Diagnosis Date   Chest pain    Leg swelling    Near syncope    Obesity    Palpitations    Seizures (HCC)      PAST SURGICAL HISTORY: Past Surgical History:  Procedure Laterality Date   APPENDECTOMY  01/2011   COLONOSCOPY     removed pre-cancerous polyps   KNEE SURGERY Right 12/08/2016     FAMILY HISTORY: Family  History  Problem Relation Age of Onset   Colon cancer Mother    Heart failure Father    Breast cancer Other        Aunt   Epilepsy Sister    Skin cancer Sister      SOCIAL HISTORY: Social History   Socioeconomic History   Marital status: Married    Spouse name: Not on file   Number of children: Not on file   Years of education: Not on file   Highest education level: Not on file  Occupational History   Not on file  Tobacco Use   Smoking status: Former   Smokeless tobacco: Never   Tobacco comments:    Quit- 1995  Substance and Sexual Activity   Alcohol use: No    Comment: Quit in 1989   Drug use: No   Sexual activity: Not on file  Other Topics Concern   Not on file  Social History Narrative   Patient is married and lives with her husband and son.   Patient is a Pharmacist, hospital in [place].   Patient is right-handed.   Patient drinks one cup of coffee daily.   Social Determinants of Health   Financial Resource Strain: Not on file  Food Insecurity: Not on file  Transportation Needs: Not on file  Physical Activity: Not on file  Stress: Not on file  Social Connections: Not on file  Intimate Partner Violence: Not on file     PHYSICAL EXAM  There were no vitals filed for this visit. There is no height or weight on file to calculate BMI.  Generalized: Well developed, in no acute distress  Cardiology: normal rate and rhythm, no murmur auscultated  Respiratory: clear to auscultation bilaterally    Neurological examination  Mentation: Alert oriented to time, place, history taking. Follows all commands speech and language fluent Cranial nerve II-XII: Pupils were equal round reactive to light. Extraocular movements were full, visual field were full on confrontational test. Facial sensation and strength were normal. Uvula tongue midline. Head turning and shoulder shrug  were normal and symmetric. Motor: The motor testing reveals 5 over 5 strength of all 4 extremities.  Good symmetric motor tone is noted throughout.  Sensory: Sensory testing is intact to soft touch on all 4 extremities. No evidence of extinction is noted.  Coordination: Cerebellar testing reveals good finger-nose-finger and heel-to-shin bilaterally.  Gait and station: Gait is normal. Tandem gait is normal. Romberg is negative. No drift is seen.  Reflexes: Deep tendon reflexes are symmetric and normal bilaterally.    DIAGNOSTIC DATA (LABS, IMAGING, TESTING) - I reviewed patient records, labs, notes, testing and imaging myself where available.  Lab Results  Component Value Date   WBC 6.6 08/10/2022   HGB 14.6 08/10/2022   HCT 43.2 08/10/2022   MCV 92.1 08/10/2022   PLT 244 08/10/2022      Component Value Date/Time   NA 142 08/10/2022 1338   NA 142 04/06/2016 1005   K 4.1 08/10/2022 1338   CL 108 08/10/2022 1338   CO2 22 08/10/2022 1338   GLUCOSE 112 (H) 08/10/2022 1338   BUN 13 08/10/2022 1338   BUN 11 04/06/2016 1005   CREATININE 0.78 08/10/2022 1338   CALCIUM 9.9 08/10/2022 1338   PROT 6.6 09/05/2019 1715   PROT 6.7 04/06/2016 1005   ALBUMIN 3.7 09/05/2019 1715   ALBUMIN 4.5 04/06/2016 1005   AST 22 09/05/2019 1715   ALT 30 09/05/2019 1715   ALKPHOS 59 09/05/2019 1715   BILITOT 0.4 09/05/2019 1715   BILITOT 0.3 04/06/2016 1005   GFRNONAA >60 08/10/2022 1338   GFRAA >60 09/05/2019 1715   Lab Results  Component Value Date   TRIG 151 (H) 09/05/2019   No results found for: "HGBA1C" No results found for: "VITAMINB12" No results found for: "TSH"      No data to display               No data to display           ASSESSMENT AND PLAN  57 y.o. year old female  has a past medical history of Chest pain, Leg swelling, Near syncope, Obesity, Palpitations, and Seizures ([place]). here with    No diagnosis found.  Thornton Papas ***.  Healthy lifestyle habits encouraged. *** will follow up with PCP as directed. *** will return to see me in ***, sooner if needed.  *** verbalizes understanding and agreement with this plan.   No orders of the defined types were placed in this encounter.    No orders of the defined types were placed in this encounter.    Debbora Presto, MSN, FNP-C 01/19/2023, 4:35 PM  [facility] [address], [place], [place] [address] [phone]

## 2023-01-19 NOTE — Telephone Encounter (Signed)
Rx sent to the requested pharmacy.

## 2023-01-21 ENCOUNTER — Encounter: Payer: Self-pay | Admitting: Family Medicine

## 2023-01-21 ENCOUNTER — Ambulatory Visit: Payer: BC Managed Care – PPO | Admitting: Family Medicine

## 2023-01-21 ENCOUNTER — Other Ambulatory Visit: Payer: Self-pay | Admitting: Family Medicine

## 2023-01-21 VITALS — BP 162/98 | HR 84 | Ht 66.0 in | Wt 260.5 lb

## 2023-01-21 DIAGNOSIS — G40909 Epilepsy, unspecified, not intractable, without status epilepticus: Secondary | ICD-10-CM

## 2023-01-21 MED ORDER — LEVETIRACETAM ER 500 MG PO TB24: 2500.0000 mg | ORAL_TABLET | Freq: Every day | ORAL | 3 refills | Status: DC

## 2023-01-28 ENCOUNTER — Ambulatory Visit: Payer: BC Managed Care – PPO

## 2023-02-08 ENCOUNTER — Ambulatory Visit: Payer: BC Managed Care – PPO | Attending: Internal Medicine | Admitting: Pharmacist

## 2023-02-08 VITALS — BP 132/94 | HR 78 | Wt 252.5 lb

## 2023-02-08 DIAGNOSIS — I509 Heart failure, unspecified: Secondary | ICD-10-CM

## 2023-02-08 MED ORDER — DAPAGLIFLOZIN PROPANEDIOL 10 MG PO TABS: 10.0000 mg | ORAL_TABLET | Freq: Every day | ORAL | 11 refills | Status: DC

## 2023-02-08 NOTE — Progress Notes (Signed)
Patient ID: CLARISSA Hampton                 DOB: [birth year]                      MRN: [id number]     HPI: Julie Hampton is a 57 y.o. female referred by Dr. Gasper Sells to pharmacy clinic for HF medication management. PMH is significant for HFmrEF, chest pain, palpitations, near syncope, obesity, seizure disorder, and long COVID. Most recent LVEF 45-50% on 09/2022 echo. Pt started on losartan 12.'5mg'$  daily at that time. Stopped a week later at MD visit. Most recently carvedilol 3.'125mg'$  BID was changed to Toprol '25mg'$  daily for less effect on BP to allow for optimization of other CHF medications.  Pt sent MyChart message 01/09/23 reporting fluttering of her heart had returned and was waking her up at night. BP and HR log was stable. At follow up today, reports her palpitations have improved the longer she's been on metoprolol and they're no longer noticeable. Denies dizziness, LE edema, or SOB. Has some fatigue. Reports losartan was stopped previously due to low BP.   Has had multiple knee surgeries, still recovering from her most recent surgery in November 2023. Finished with PT but still doing home exercises. BP log detailed below. Occasionally has low systolic [id number] but feels fine when this happens. Brought her home bicep BP cuff to visit today. Took AM meds just before her visit.  Home cuff reading: 128/98 Clinic reading: 132/94  Current CHF meds: Toprol '25mg'$  daily Previously tried: carvedilol - low BP, losartan 12.'5mg'$  daily - low BP  BP goal: <130/54mHg  Family History: Breast cancer in an other family member; Colon cancer in her mother; Epilepsy in her sister; Heart failure in her father; Skin cancer in her sister.   Social History: Former tobacco use, quit in 1995. No alcohol or drug use.  Diet: alpha gal so chicken and [place] primary meats. Cutting back on carbs. Breakfast: egg, cheddar cheese with wheat wrap, special K protein cereals  Lunch: grilled chicken salad Dinner: chicken and  vegetables  Eats out 5-6 meals per week   Exercise: PT exercises s/p knee surgery November 2023  Home BP readings:    Wt Readings from Last 3 Encounters:  01/21/23 260 lb 8 oz (118.2 kg)  12/03/22 261 lb 3.2 oz (118.5 kg)  10/05/22 248 lb (112.5 kg)   BP Readings from Last 3 Encounters:  01/21/23 (!) 162/98  12/17/22 122/70  12/03/22 122/84   Pulse Readings from Last 3 Encounters:  01/21/23 84  12/17/22 77  12/03/22 88    Renal function: CrCl cannot be calculated (Patient's most recent lab result is older than the maximum 21 days allowed.).  Past Medical History:  Diagnosis Date   Chest pain    Leg swelling    Near syncope    Obesity    Palpitations    Seizures (HCC)     Current Outpatient Medications on File Prior to Visit  Medication Sig Dispense Refill   albuterol (VENTOLIN HFA) 108 (90 Base) MCG/ACT inhaler Inhale 2 puffs into the lungs every 6 (six) hours as needed for wheezing or shortness of breath.     EPINEPHrine (EPIPEN 2-PAK) 0.3 mg/0.3 mL IJ SOAJ injection Inject 0.3 mg into the muscle as needed for anaphylaxis.     levETIRAcetam (KEPPRA XR) 500 MG 24 hr tablet Take 5 tablets (2,500 mg total) by mouth at bedtime. Take 2 Tablets  By Mouth In The Morning, 3 Tablets In The Evening (About 12 Hours Apart) 450 tablet 3   metoprolol succinate (TOPROL XL) 25 MG 24 hr tablet Take 1 tablet (25 mg total) by mouth daily. Stop taking carvedilol 3.125 30 tablet 10   No current facility-administered medications on file prior to visit.    Allergies  Allergen Reactions   Iodinated Contrast Media Hives and Shortness Of Breath   Penicillins Anaphylaxis    Did it involve swelling of the face/tongue/throat, SOB, or low BP? Yes Did it involve sudden or severe rash/hives, skin peeling, or any reaction on the inside of your mouth or nose? Yes Did you need to seek medical attention at a hospital or doctor's office? Yes When did it last happen?   Over 20 Years Ago    If all  above answers are "NO", may proceed with cephalosporin use.     Thallous Chloride Tl 201 Hives    Contrast dye for cardiac stress test.  Had increased heart rate, hives.   Morphine And Related Other (See Comments)    L leg (paralysis). Lasted 18-24 hours.   L leg (paralysis). Lasted 18-24 hours.     Other     Banana peppers   Alpha-Gal Hives and Palpitations    Other reaction(s): Wheezing   Beef (Bovine) Protein Rash   Latex Dermatitis    Other reaction(s): Not available   Strawberry Extract Rash    Other reaction(s): Not available   Thallium Hives    Contrast dye for cardiac stress test.  Had increased heart rate, hives.     Assessment/Plan:  1. HFmrEF - BP mildly elevated today above goal < 130/26mHg however home readings are consistently at goal and her cuff is measuring accurately. Occasionally having low BPs at home however asymptomatic during these times. Previously hypotensive on low dose carvedilol 3.'125mg'$  BID and losartan 12.'5mg'$  daily. Will start Farxiga '10mg'$  daily for CHF benefit with less effect on BP. $0 copay card activated today and provided to pt. Continue Toprol '25mg'$  daily. She sees EJaquelyn Bitterin 3 weeks for follow up, will check BMET at that time. Can consider trial of low dose spironolactone or rechallenge of ARB/ARNI at that time pending BP.  Miroslava Santellan E. Joselyne Spake, PharmD, BCACP, [place] [address] [place] [address]Phone: [phone] Fax: [phone]/03/2023 7:39 AM

## 2023-02-08 NOTE — Patient Instructions (Signed)
Your blood pressure goal is < 130/54mHg  Start taking Farxiga '10mg'$  - 1 tablet once daily  Continue taking your other medications and monitoring your blood pressure and weight at home  Keep your follow up with EJaquelyn Bitterat the end of the month. We'll recheck your blood pressure and lab work at that visit

## 2023-02-11 ENCOUNTER — Other Ambulatory Visit: Payer: Self-pay | Admitting: Family Medicine

## 2023-02-16 ENCOUNTER — Telehealth: Payer: Self-pay | Admitting: Family Medicine

## 2023-02-16 MED ORDER — LEVETIRACETAM ER 500 MG PO TB24: 2500.0000 mg | ORAL_TABLET | Freq: Every day | ORAL | 3 refills | Status: DC

## 2023-02-16 NOTE — Telephone Encounter (Signed)
Amy can you clarify the directions on Keppra 2,500 mg . The pharmacy won't fill Rx with 2 set of directions on Rx.  Directions are "Summary: Take 5 tablets (2,500 mg total) by mouth at bedtime. Take 2 Tablets By Mouth In The Morning, 3 Tablets In The Evening (About 12 Hours Apart)"  Is patient to take 5 tablet at bedtime? Or 2 tablets in am and 3 tablet in pm?   Please advise

## 2023-02-16 NOTE — Telephone Encounter (Signed)
Patient informed new Rx sent.  

## 2023-02-16 NOTE — Telephone Encounter (Signed)
Pt said, pharmacy refuse to do levETIRAcetam (KEPPRA XR) 500 MG 24 hr tablet refill because both set of instructions in the prescription. Should be just taking 5 tablets at night. Could you call the pharmacy to correct the prescription. Pt is out of medication. Would like call back to confirm to be able pick up medication.

## 2023-03-01 NOTE — Progress Notes (Unsigned)
Office Visit    Patient Name: Julie Hampton Date of Encounter: 03/02/2023  Primary Care Provider:  Leonides Sake, MD Primary Cardiologist:  Werner Lean, MD Primary Electrophysiologist: None  Chief Complaint    Julie Hampton is a 57 y.o. female with PMH of HFmrEF, SVT, obesity, palpitations, near-syncope who presents today for 21-month follow-up of congestive heart failure.  Past Medical History    Past Medical History:  Diagnosis Date   Chest pain    Leg swelling    Near syncope    Obesity    Palpitations    Seizures ([place])    Past Surgical History:  Procedure Laterality Date   APPENDECTOMY  01/2011   COLONOSCOPY     removed pre-cancerous polyps   KNEE SURGERY Right 12/08/2016   KNEE SURGERY  10/2022    Allergies  Allergies  Allergen Reactions   Iodinated Contrast Media Hives and Shortness Of Breath   Penicillins Anaphylaxis    Did it involve swelling of the face/tongue/throat, SOB, or low BP? Yes Did it involve sudden or severe rash/hives, skin peeling, or any reaction on the inside of your mouth or nose? Yes Did you need to seek medical attention at a hospital or doctor's office? Yes When did it last happen?   Over 20 Years Ago    If all above answers are "NO", may proceed with cephalosporin use.     Thallous Chloride Tl 201 Hives    Contrast dye for cardiac stress test.  Had increased heart rate, hives.   Morphine And Related Other (See Comments)    L leg (paralysis). Lasted 18-24 hours.   L leg (paralysis). Lasted 18-24 hours.     Other     Banana peppers   Alpha-Gal Hives and Palpitations    Other reaction(s): Wheezing   Beef (Bovine) Protein Rash   Latex Dermatitis    Other reaction(s): Not available   Strawberry Extract Rash    Other reaction(s): Not available   Thallium Hives    Contrast dye for cardiac stress test.  Had increased heart rate, hives.    History of Present Illness    Julie Hampton  is a 57 year old female with the  above mention past medical history who presents today for 34-month follow-up of congestive heart failure. Ms. Karim was initially seen by Dr. Gasper Sells to establish care on 09/2022 following ED visit for complaint of chest pain and palpitations.  She had no previous history of coronary disease and underwent LHC in 2015 that showed no evidence of obstruction.  She also endorsed intermittent episodes of palpitations.  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  She was started on Cardizem as needed and 14-day ZIO monitor was completed that revealed PVCs and PACs.  2D echo was completed that revealed mildly reduced EF of 45-50% with grade 1 DD and global hypokinesis.  Plan for patient to have CMR complete if unable to titrate medications further.  She was last seen by Dr. Gasper Sells on 10/05/2022 for follow-up and surgical clearance.    She had isolated PVCs and was started on carvedilol with plan for further titration following her surgery.  She was tolerating carvedilol without any fatigue or bradycardia.  She had a follow-up with Pharm.D. for CHF medication titration and carvedilol was switched to metoprolol due to low blood pressure and heart rate.  She returned for follow-up on 02/2023 and had finished physical therapy.  She reported occasional low  systolic blood pressures but was asymptomatic.  She was started on Farxiga with plan to initiate ARB or ARNI if BP allows at follow-up.  Ms. [place] presents today for 2-week follow-up of congestive heart failure.  Since last being seen in the office patient reports that she has been doing well with no new cardiac complaints.  She is tolerating her new medications without any adverse reactions.  Today her blood pressure is well-controlled at 110/78 and heart rate is 60 bpm.  She has experienced very minimal palpitations and none have been sustained.  Her husband is currently ill and is in the hospital.  She has been under increased amount of stress and this has  been a contributing factor to her palpitations.  She is abstaining from excess salt in her diet and has been monitoring her weight at home.  During today's visit we discussed the 4 pillars of heart failure medications and reviewed the titration process.  She had all questions answered to her satisfaction.  Patient denies chest pain, palpitations, dyspnea, PND, orthopnea, nausea, vomiting, dizziness, syncope, edema, weight gain, or early satiety.  Home Medications    Current Outpatient Medications  Medication Sig Dispense Refill   albuterol (VENTOLIN HFA) 108 (90 Base) MCG/ACT inhaler Inhale 2 puffs into the lungs every 6 (six) hours as needed for wheezing or shortness of breath.     dapagliflozin propanediol (FARXIGA) 10 MG TABS tablet Take 1 tablet (10 mg total) by mouth daily before breakfast. 30 tablet 11   EPINEPHrine (EPIPEN 2-PAK) 0.3 mg/0.3 mL IJ SOAJ injection Inject 0.3 mg into the muscle as needed for anaphylaxis.     levETIRAcetam (KEPPRA XR) 500 MG 24 hr tablet Take 5 tablets (2,500 mg total) by mouth at bedtime. 450 tablet 3   metoprolol succinate (TOPROL XL) 25 MG 24 hr tablet Take 1 tablet (25 mg total) by mouth daily. Stop taking carvedilol 3.125 30 tablet 10   No current facility-administered medications for this visit.     Review of Systems  Please see the history of present illness.    (+) Anxiety, stress (+) Palpitations  All other systems reviewed and are otherwise negative except as noted above.  Physical Exam    Wt Readings from Last 3 Encounters:  03/02/23 251 lb 12.8 oz (114.2 kg)  02/08/23 252 lb 8 oz (114.5 kg)  01/21/23 260 lb 8 oz (118.2 kg)   VS: Vitals:   03/02/23 0807  BP: 110/78  Pulse: 68  SpO2: 96%  ,Body mass index is 40.64 kg/m.  Constitutional:      Appearance: Healthy appearance. Not in distress.  Neck:     Vascular: JVD normal.  Pulmonary:     Effort: Pulmonary effort is normal.     Breath sounds: No wheezing. No rales. Diminished in  the bases Cardiovascular:     Normal rate. Regular rhythm. Normal S1. Normal S2.      Murmurs: There is no murmur.  Edema:    Peripheral edema absent.  Abdominal:     Palpations: Abdomen is soft non tender. There is no hepatomegaly.  Skin:    General: Skin is warm and dry.  Neurological:     General: No focal deficit present.     Mental Status: Alert and oriented to person, place and time.     Cranial Nerves: Cranial nerves are intact.  EKG/LABS/ Recent Cardiac Studies    ECG personally reviewed by me today -none completed today   Lab Results  Component Value  Date   WBC 6.6 08/10/2022   HGB 14.6 08/10/2022   HCT 43.2 08/10/2022   MCV 92.1 08/10/2022   PLT 244 08/10/2022   Lab Results  Component Value Date   CREATININE 0.78 08/10/2022   BUN 13 08/10/2022   NA 142 08/10/2022   K 4.1 08/10/2022   CL 108 08/10/2022   CO2 22 08/10/2022   Lab Results  Component Value Date   ALT 30 09/05/2019   AST 22 09/05/2019   ALKPHOS 59 09/05/2019   BILITOT 0.4 09/05/2019   Lab Results  Component Value Date   TRIG 151 (H) 09/05/2019    No results found for: "HGBA1C"  Cardiac Studies & Procedures       ECHOCARDIOGRAM  ECHOCARDIOGRAM COMPLETE 09/28/2022  Narrative ECHOCARDIOGRAM REPORT    Patient Name:   LISETH RYON Kazee  Date of Exam: 09/28/2022 Medical Rec #:  [id number]     Height:       66.0 in Accession #:    [id number]    Weight:       256.0 lb Date of Birth:  [birth year]     BSA:          2.221 m Patient Age:    24 years      BP:           128/86 mmHg Patient Gender: F             HR:           72 bpm. Exam Location:  [facility]  Procedure: 2D Echo, Cardiac Doppler and Color Doppler  Indications:    I47.1 SVT  History:        Patient has no prior history of Echocardiogram examinations. Signs/Symptoms:Chest Pain. Palpitations. Near syncope.  Sonographer:    Cresenciano Lick RDCS Referring Phys: [id number] [facility] A CHANDRASEKHAR  IMPRESSIONS   1. Left  ventricular ejection fraction, by estimation, is 45 to 50%. The left ventricle has mildly decreased function. The left ventricle demonstrates global hypokinesis. Left ventricular diastolic parameters are consistent with Grade I diastolic dysfunction (impaired relaxation). 2. Right ventricular systolic function is mildly reduced. The right ventricular size is normal. Tricuspid regurgitation signal is inadequate for assessing PA pressure. 3. The mitral valve is normal in structure. Trivial mitral valve regurgitation. No evidence of mitral stenosis. 4. The aortic valve is tricuspid. Aortic valve regurgitation is trivial. No aortic stenosis is present.  FINDINGS Left Ventricle: Left ventricular ejection fraction, by estimation, is 45 to 50%. The left ventricle has mildly decreased function. The left ventricle demonstrates global hypokinesis. The left ventricular internal cavity size was normal in size. There is no left ventricular hypertrophy. Left ventricular diastolic parameters are consistent with Grade I diastolic dysfunction (impaired relaxation).  Right Ventricle: The right ventricular size is normal. Right vetricular wall thickness was not well visualized. Right ventricular systolic function is mildly reduced. Tricuspid regurgitation signal is inadequate for assessing PA pressure.  Left Atrium: Left atrial size was normal in size.  Right Atrium: Right atrial size was normal in size.  Pericardium: There is no evidence of pericardial effusion.  Mitral Valve: The mitral valve is normal in structure. Trivial mitral valve regurgitation. No evidence of mitral valve stenosis.  Tricuspid Valve: The tricuspid valve is normal in structure. Tricuspid valve regurgitation is trivial.  Aortic Valve: The aortic valve is tricuspid. Aortic valve regurgitation is trivial. No aortic stenosis is present.  Pulmonic Valve: The pulmonic valve was not well visualized. Pulmonic valve regurgitation is not  visualized.  Aorta: The aortic root is normal in size and structure.  IAS/Shunts: The interatrial septum was not well visualized.   LEFT VENTRICLE PLAX 2D LVIDd:         4.80 cm   Diastology LVIDs:         3.40 cm   LV e' medial:    4.13 cm/s LV PW:         0.90 cm   LV E/e' medial:  12.2 LV IVS:        0.90 cm   LV e' lateral:   8.92 cm/s LVOT diam:     2.10 cm   LV E/e' lateral: 5.6 LV SV:         53 LV SV Index:   24 LVOT Area:     3.46 cm   RIGHT VENTRICLE RV Basal diam:  3.10 cm RV S prime:     10.93 cm/s TAPSE (M-mode): 2.0 cm  LEFT ATRIUM             Index        RIGHT ATRIUM           Index LA diam:        4.10 cm 1.85 cm/m   RA Area:     12.90 cm LA Vol (A2C):   33.0 ml 14.86 ml/m  RA Volume:   34.20 ml  15.40 ml/m LA Vol (A4C):   32.2 ml 14.50 ml/m LA Biplane Vol: 33.2 ml 14.95 ml/m AORTIC VALVE LVOT Vmax:   72.35 cm/s LVOT Vmean:  49.300 cm/s LVOT VTI:    0.153 m  AORTA Ao Root diam: 3.30 cm Ao Asc diam:  3.70 cm  MITRAL VALVE MV Area (PHT): 3.77 cm    SHUNTS MV Decel Time: 201 msec    Systemic VTI:  0.15 m MV E velocity: 50.30 cm/s  Systemic Diam: 2.10 cm MV A velocity: 86.75 cm/s MV E/A ratio:  0.58  Oswaldo Milian MD Electronically signed by Oswaldo Milian MD Signature Date/Time: 09/28/2022/11:25:13 AM    Final    MONITORS  LONG TERM MONITOR (3-14 DAYS) 10/04/2022  Narrative   Patient had a minimum heart rate of 50 bpm, maximum heart rate of 169 bpm, and average heart rate of 92 bpm.   Predominant underlying rhythm was sinus rhythm.   Two runs of NSVT longest lasting 10 beats.   Isolated PACs were rare (<1.0%).   Isolated PVCs were rare (<1.0%).   Triggered and diary events associated with sinus rhythm, PVCs, and PACs.  Rarely symptomatic PVCs and PACs.           Assessment & Plan    1.HFmrEF: -2D echo completed 10/23 with EF of 45-50% -Today patient is euvolemic on exam and has not experienced any excess  shortness of breath since previous visit. -She is abstaining from excess salt in her diet and was encouraged to perform daily weights. -She was recently started on Farxiga and is tolerating her medication well without any adverse reactions. -We will further titrate GDMT and add spironolactone 12.5 mg daily -We will check BMET in 2 weeks -Low sodium diet, fluid restriction <2L, and daily weights encouraged. Educated to contact our office for weight gain of 2 lbs overnight or 5 lbs in one week.    2.  Dyspnea on exertion: -Patient reports no ongoing dyspnea on exertion and is abstaining from excess salt in her diet. -She is euvolemic on exam today and has been monitoring her weights daily.  3.  Palpitations: -She reports some palpitations that may be attributed to her increased stress.  She reports that her husband is currently in the hospital dealing with an acute illness. -She is aware to continue her beta-blocker and she was advised to contact our office if palpitations become more bothersome and are sustained.   4.  Morbid obesity: -BMI is 41.32 kg/m  -He was advised to follow a low-sodium heart healthy diet. -She is down 9 pounds today and is doing a good job with abstaining from excess salt in her diet.  Disposition: Follow-up with Werner Lean, MD or APP in 1 months   Medication Adjustments/Labs and Tests Ordered: Current medicines are reviewed at length with the patient today.  Concerns regarding medicines are outlined above.   Signed, Mable Fill, Marissa Nestle, NP 03/02/2023, 8:18 AM [facility]  Note:  This document was prepared using Dragon voice recognition software and may include unintentional dictation errors.

## 2023-03-02 ENCOUNTER — Other Ambulatory Visit: Payer: Self-pay | Admitting: Pharmacist

## 2023-03-02 ENCOUNTER — Ambulatory Visit: Payer: BC Managed Care – PPO | Attending: Nurse Practitioner | Admitting: Nurse Practitioner

## 2023-03-02 ENCOUNTER — Encounter: Payer: Self-pay | Admitting: Nurse Practitioner

## 2023-03-02 ENCOUNTER — Ambulatory Visit: Payer: BC Managed Care – PPO

## 2023-03-02 VITALS — BP 110/78 | HR 68 | Ht 66.0 in | Wt 251.8 lb

## 2023-03-02 DIAGNOSIS — I5022 Chronic systolic (congestive) heart failure: Secondary | ICD-10-CM | POA: Diagnosis not present

## 2023-03-02 DIAGNOSIS — R0609 Other forms of dyspnea: Secondary | ICD-10-CM | POA: Diagnosis not present

## 2023-03-02 DIAGNOSIS — I509 Heart failure, unspecified: Secondary | ICD-10-CM

## 2023-03-02 DIAGNOSIS — R002 Palpitations: Secondary | ICD-10-CM | POA: Diagnosis not present

## 2023-03-02 MED ORDER — METOPROLOL SUCCINATE ER 25 MG PO TB24: 25.0000 mg | ORAL_TABLET | Freq: Every day | ORAL | 0 refills | Status: DC

## 2023-03-02 MED ORDER — SPIRONOLACTONE 25 MG PO TABS: 12.5000 mg | ORAL_TABLET | Freq: Every day | ORAL | 0 refills | Status: DC

## 2023-03-02 NOTE — Patient Instructions (Addendum)
Medication Instructions:  START Spironolactone 12.5mg  Take 1 tablet once a day (tablet comes in 25mg  break in half and take half tablet once a day) *If you need a refill on your cardiac medications before your next appointment, please call your pharmacy*   Lab Work: BMET IN 2 WEEKS If you have labs (blood work) drawn today and your tests are completely normal, you will receive your results only by: [facility] (if you have MyChart) OR A paper copy in the mail If you have any lab test that is abnormal or we need to change your treatment, we will call you to review the results.   Testing/Procedures: None ordered   Follow-Up: At [facility], you and your health needs are our priority.  As part of our continuing mission to provide you with exceptional heart care, we have created designated Provider Care Teams.  These Care Teams include your primary Cardiologist (physician) and Advanced Practice Providers (APPs -  Physician Assistants and Nurse Practitioners) who all work together to provide you with the care you need, when you need it.  We recommend signing up for the patient portal called "MyChart".  Sign up information is provided on this After Visit Summary.  MyChart is used to connect with patients for Virtual Visits (Telemedicine).  Patients are able to view lab/test results, encounter notes, upcoming appointments, etc.  Non-urgent messages can be sent to your provider as well.   To learn more about what you can do with MyChart, go to [email or web address].    Your next appointment:   1 month(s)  Provider:   Werner Lean, MD  or Ambrose Pancoast, NP   Other Instructions:

## 2023-03-03 LAB — BASIC METABOLIC PANEL
BUN/Creatinine Ratio: 19 (ref 9–23)
BUN: 15 mg/dL (ref 6–24)
CO2: 22 mmol/L (ref 20–29)
Calcium: 9.8 mg/dL (ref 8.7–10.2)
Chloride: 104 mmol/L (ref 96–106)
Creatinine, Ser: 0.79 mg/dL (ref 0.57–1.00)
Glucose: 141 mg/dL — ABNORMAL HIGH (ref 70–99)
Potassium: 4.4 mmol/L (ref 3.5–5.2)
Sodium: 143 mmol/L (ref 134–144)
eGFR: 89 mL/min/{1.73_m2} (ref >59)

## 2023-03-17 ENCOUNTER — Ambulatory Visit: Payer: BC Managed Care – PPO

## 2023-03-18 ENCOUNTER — Ambulatory Visit: Payer: BC Managed Care – PPO | Attending: Nurse Practitioner

## 2023-03-18 DIAGNOSIS — I5022 Chronic systolic (congestive) heart failure: Secondary | ICD-10-CM

## 2023-03-18 DIAGNOSIS — R0609 Other forms of dyspnea: Secondary | ICD-10-CM

## 2023-03-18 DIAGNOSIS — R002 Palpitations: Secondary | ICD-10-CM

## 2023-03-18 DIAGNOSIS — I5020 Unspecified systolic (congestive) heart failure: Secondary | ICD-10-CM

## 2023-03-19 ENCOUNTER — Other Ambulatory Visit: Payer: BC Managed Care – PPO

## 2023-03-19 LAB — BASIC METABOLIC PANEL
BUN/Creatinine Ratio: 19 (ref 9–23)
BUN: 14 mg/dL (ref 6–24)
CO2: 23 mmol/L (ref 20–29)
Calcium: 10 mg/dL (ref 8.7–10.2)
Chloride: 103 mmol/L (ref 96–106)
Creatinine, Ser: 0.75 mg/dL (ref 0.57–1.00)
Glucose: 87 mg/dL (ref 70–99)
Potassium: 4.6 mmol/L (ref 3.5–5.2)
Sodium: 140 mmol/L (ref 134–144)
eGFR: 95 mL/min/{1.73_m2} (ref >59)

## 2023-03-29 ENCOUNTER — Telehealth: Payer: Self-pay

## 2023-03-29 NOTE — Telephone Encounter (Signed)
Message was sent via MyChart: Hi Julie Hampton, I have some concerns about this new medication spironolactone 25 MG tablet When I first started taking it, as you noted, my husband was facing emergency surgery and things were just stressful so I thought that was causing my fatigue,  short periods of elevated heart rate, shortness of breath etc.. Now that we have been home and I am rested, these symptoms have not subsided.  I feel more fatigued and have noted some swelling in my right leg (it does that anyway due to multiple knee surgeries) but it is just a bit more. I have had one two-hour episode of heart rate 105 range, and feel generally fatigued. I thought this information would be helpful for our appointment this week.    Called and explained Julie Server NP recommendations :  Please advise Julie Hampton to take an additional 25 mg of Toprol-XL for elevated heart rate  and palpitations.  We can discontinue the spironolactone today and discuss further at her follow-up.   Please advise her if tachycardia or palpitations are sustained she should go to the ED for evaluation.   Please let me know if you have any additional questions.    Patient voiced understanding.

## 2023-03-31 NOTE — Progress Notes (Unsigned)
Office Visit    Patient Name: Julie Hampton Date of Encounter: 04/01/2023  Primary Care Provider:  Ailene Ravel, MD Primary Cardiologist:  Christell Constant, MD Primary Electrophysiologist: None   Past Medical History    Past Medical History:  Diagnosis Date   Chest pain    Leg swelling    Near syncope    Obesity    Palpitations    Seizures    Past Surgical History:  Procedure Laterality Date   APPENDECTOMY  01/2011   COLONOSCOPY     removed pre-cancerous polyps   KNEE SURGERY Right 12/08/2016   KNEE SURGERY  10/2022    Allergies  Allergies  Allergen Reactions   Iodinated Contrast Media Hives and Shortness Of Breath   Penicillins Anaphylaxis    Did it involve swelling of the face/tongue/throat, SOB, or low BP? Yes Did it involve sudden or severe rash/hives, skin peeling, or any reaction on the inside of your mouth or nose? Yes Did you need to seek medical attention at a hospital or doctor's office? Yes When did it last happen?   Over 20 Years Ago    If all above answers are "NO", may proceed with cephalosporin use.     Thallous Chloride Tl 201 Hives    Contrast dye for cardiac stress test.  Had increased heart rate, hives.   Morphine And Related Other (See Comments)    L leg (paralysis). Lasted 18-24 hours.   L leg (paralysis). Lasted 18-24 hours.     Other     Banana peppers   Alpha-Gal Hives and Palpitations    Other reaction(s): Wheezing   Beef (Bovine) Protein Rash   Latex Dermatitis    Other reaction(s): Not available   Strawberry Extract Rash    Other reaction(s): Not available   Thallium Hives    Contrast dye for cardiac stress test.  Had increased heart rate, hives.     History of Present Illness    Julie Hampton is a 57 y.o. female with PMH of HFmrEF, SVT, obesity, palpitations, near-syncope who presents today for 1 month follow-up. Julie Hampton was initially seen by Dr. Izora Ribas to establish care on 09/2022 following ED visit for  complaint of chest pain and palpitations. She was started on Cardizem as needed and 14-day ZIO monitor was completed that revealed PVCs and PACs.  2D echo was completed that revealed mildly reduced EF of 45-50% with grade 1 DD and global hypokinesis.  Plan for patient to have CMR complete if unable to titrate medications further.  She was last seen by Dr. Izora Ribas on 10/05/2022 for follow-up and surgical clearance.    She had isolated PVCs and was started on carvedilol with plan for further titration following her surgery.  She was tolerating carvedilol without any fatigue or bradycardia.  She had a follow-up with Pharm.D. for CHF medication titration and carvedilol was switched to metoprolol due to low blood pressure and heart rate.  She returned for follow-up on 02/2023 and had finished physical therapy.  She reported occasional low systolic blood pressures but was asymptomatic.  She was started on Farxiga with plan to initiate ARB or ARNI if BP allows at follow-up.  She was seen in follow-up 03/02/2023 with well-controlled BP.  She was dealing with stress from her husband being ill in the hospital.  Spironolactone was added to her current medication regimen however she developed adverse reactions and this was discontinued.  Julie Hampton presents today for 1 month  follow-up.  Since last being seen in the office patient reports she is feeling much better after discontinuing spironolactone.  She had developed dizziness and fatigue with her current dose.  She also reports that her husband is recovering and is home now.  She feels that her stress level has improved since her previous visit.  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  She reports some fatigue at the end of the day but has no shortness of breath with walking.  She is compliant with her current meds and denies any adverse reactions currently.  During today's visit we discussed importance of primary prevention and also  the pathophysiology of CHF.  She had all questions answered to her satisfaction..  Patient denies chest pain, palpitations, dyspnea, PND, orthopnea, nausea, vomiting, dizziness, syncope, edema, weight gain, or early satiety.   Home Medications    Current Outpatient Medications  Medication Sig Dispense Refill   albuterol (VENTOLIN HFA) 108 (90 Base) MCG/ACT inhaler Inhale 2 puffs into the lungs every 6 (six) hours as needed for wheezing or shortness of breath.     dapagliflozin propanediol (FARXIGA) 10 MG TABS tablet Take 1 tablet (10 mg total) by mouth daily before breakfast. 30 tablet 11   EPINEPHrine (EPIPEN 2-PAK) 0.3 mg/0.3 mL IJ SOAJ injection Inject 0.3 mg into the muscle as needed for anaphylaxis.     levETIRAcetam (KEPPRA XR) 500 MG 24 hr tablet Take 5 tablets (2,500 mg total) by mouth at bedtime. 450 tablet 3   metoprolol succinate (TOPROL XL) 25 MG 24 hr tablet Take 1 tablet (25 mg total) by mouth daily. Stop taking carvedilol 3.125 90 tablet 0   No current facility-administered medications for this visit.     Review of Systems  Please see the history of present illness.    (+) Fatigue (+) Shortness of breath with heavy exertion  All other systems reviewed and are otherwise negative except as noted above.  Physical Exam    Wt Readings from Last 3 Encounters:  04/01/23 250 lb 3.2 oz (113.5 kg)  03/02/23 251 lb 12.8 oz (114.2 kg)  02/08/23 252 lb 8 oz (114.5 kg)   VS: Vitals:   04/01/23 0949  BP: 128/84  Pulse: 90  SpO2: 97%  ,Body mass index is 40.38 kg/m.  Constitutional:      Appearance: Healthy appearance. Not in distress.  Neck:     Vascular: JVD normal.  Pulmonary:     Effort: Pulmonary effort is normal.     Breath sounds: No wheezing. No rales. Diminished in the bases Cardiovascular:     Normal rate. Regular rhythm. Normal S1. Normal S2.      Murmurs: There is no murmur.  Edema:    Peripheral edema absent.  Abdominal:     Palpations: Abdomen is soft  non tender. There is no hepatomegaly.  Skin:    General: Skin is warm and dry.  Neurological:     General: No focal deficit present.     Mental Status: Alert and oriented to person, place and time.     Cranial Nerves: Cranial nerves are intact.  EKG/LABS/ Recent Cardiac Studies    ECG personally reviewed by me today -none completed today  Cardiac Studies & Procedures       ECHOCARDIOGRAM  ECHOCARDIOGRAM COMPLETE 09/28/2022  Narrative ECHOCARDIOGRAM REPORT    Patient Name:   RAJEAN DESANTIAGO  Date of Exam: 09/28/2022 Medical Rec #:  [id number]  Height:       66.0 in Accession #:    [id number]    Weight:       256.0 lb Date of Birth:  24-Jun-1966     BSA:          2.221 m Patient Age:    56 years      BP:           128/86 mmHg Patient Gender: F             HR:           72 bpm. Exam Location:  [address]  Procedure: 2D Echo, Cardiac Doppler and Color Doppler  Indications:    I47.1 SVT  History:        Patient has no prior history of Echocardiogram examinations. Signs/Symptoms:Chest Pain. Palpitations. Near syncope.  Sonographer:    Daphine Deutscher RDCS Referring Phys: [id number] [facility] A CHANDRASEKHAR  IMPRESSIONS   1. Left ventricular ejection fraction, by estimation, is 45 to 50%. The left ventricle has mildly decreased function. The left ventricle demonstrates global hypokinesis. Left ventricular diastolic parameters are consistent with Grade I diastolic dysfunction (impaired relaxation). 2. Right ventricular systolic function is mildly reduced. The right ventricular size is normal. Tricuspid regurgitation signal is inadequate for assessing PA pressure. 3. The mitral valve is normal in structure. Trivial mitral valve regurgitation. No evidence of mitral stenosis. 4. The aortic valve is tricuspid. Aortic valve regurgitation is trivial. No aortic stenosis is present.  FINDINGS Left Ventricle: Left ventricular ejection fraction, by estimation, is 45 to 50%. The  left ventricle has mildly decreased function. The left ventricle demonstrates global hypokinesis. The left ventricular internal cavity size was normal in size. There is no left ventricular hypertrophy. Left ventricular diastolic parameters are consistent with Grade I diastolic dysfunction (impaired relaxation).  Right Ventricle: The right ventricular size is normal. Right vetricular wall thickness was not well visualized. Right ventricular systolic function is mildly reduced. Tricuspid regurgitation signal is inadequate for assessing PA pressure.  Left Atrium: Left atrial size was normal in size.  Right Atrium: Right atrial size was normal in size.  Pericardium: There is no evidence of pericardial effusion.  Mitral Valve: The mitral valve is normal in structure. Trivial mitral valve regurgitation. No evidence of mitral valve stenosis.  Tricuspid Valve: The tricuspid valve is normal in structure. Tricuspid valve regurgitation is trivial.  Aortic Valve: The aortic valve is tricuspid. Aortic valve regurgitation is trivial. No aortic stenosis is present.  Pulmonic Valve: The pulmonic valve was not well visualized. Pulmonic valve regurgitation is not visualized.  Aorta: The aortic root is normal in size and structure.  IAS/Shunts: The interatrial septum was not well visualized.   LEFT VENTRICLE PLAX 2D LVIDd:         4.80 cm   Diastology LVIDs:         3.40 cm   LV e' medial:    4.13 cm/s LV PW:         0.90 cm   LV E/e' medial:  12.2 LV IVS:        0.90 cm   LV e' lateral:   8.92 cm/s LVOT diam:     2.10 cm   LV E/e' lateral: 5.6 LV SV:         53 LV SV Index:   24 LVOT Area:     3.46 cm   RIGHT VENTRICLE RV Basal diam:  3.10 cm RV S prime:     10.93  cm/s TAPSE (M-mode): 2.0 cm  LEFT ATRIUM             Index        RIGHT ATRIUM           Index LA diam:        4.10 cm 1.85 cm/m   RA Area:     12.90 cm LA Vol (A2C):   33.0 ml 14.86 ml/m  RA Volume:   34.20 ml  15.40 ml/m LA  Vol (A4C):   32.2 ml 14.50 ml/m LA Biplane Vol: 33.2 ml 14.95 ml/m AORTIC VALVE LVOT Vmax:   72.35 cm/s LVOT Vmean:  49.300 cm/s LVOT VTI:    0.153 m  AORTA Ao Root diam: 3.30 cm Ao Asc diam:  3.70 cm  MITRAL VALVE MV Area (PHT): 3.77 cm    SHUNTS MV Decel Time: 201 msec    Systemic VTI:  0.15 m MV E velocity: 50.30 cm/s  Systemic Diam: 2.10 cm MV A velocity: 86.75 cm/s MV E/A ratio:  0.58  Epifanio Lesches MD Electronically signed by Epifanio Lesches MD Signature Date/Time: 09/28/2022/11:25:13 AM    Final    MONITORS  LONG TERM MONITOR (3-14 DAYS) 10/04/2022  Narrative   Patient had a minimum heart rate of 50 bpm, maximum heart rate of 169 bpm, and average heart rate of 92 bpm.   Predominant underlying rhythm was sinus rhythm.   Two runs of NSVT longest lasting 10 beats.   Isolated PACs were rare (<1.0%).   Isolated PVCs were rare (<1.0%).   Triggered and diary events associated with sinus rhythm, PVCs, and PACs.  Rarely symptomatic PVCs and PACs.           Risk Assessment/Calculations:      Lab Results  Component Value Date   WBC 6.6 08/10/2022   HGB 14.6 08/10/2022   HCT 43.2 08/10/2022   MCV 92.1 08/10/2022   PLT 244 08/10/2022   Lab Results  Component Value Date   CREATININE 0.75 03/18/2023   BUN 14 03/18/2023   NA 140 03/18/2023   K 4.6 03/18/2023   CL 103 03/18/2023   CO2 23 03/18/2023   Lab Results  Component Value Date   ALT 30 09/05/2019   AST 22 09/05/2019   ALKPHOS 59 09/05/2019   BILITOT 0.4 09/05/2019   Lab Results  Component Value Date   TRIG 151 (H) 09/05/2019    No results found for: "HGBA1C"   Assessment & Plan    1.HFmrEF: -2D echo completed 10/23 with EF of 45-50% -Today patient is euvolemic on exam and has not experienced any excess shortness of breath since previous visit. -She is abstaining from excess salt in her diet and was encouraged to perform daily weights. -She is currently on maximum tolerated  GDMT and we will recheck 2D echo for evaluation of LV function. -Please continue Toprol-XL 25 mg Farxiga -Patient was advised that if EF is still reduced we will initiate Entresto. -Low sodium diet, fluid restriction <2L, and daily weights encouraged. Educated to contact our office for weight gain of 2 lbs overnight or 5 lbs in one week.    2.  Dyspnea on exertion: -Patient reports no ongoing dyspnea on exertion and is abstaining from excess salt in her diet. -She is euvolemic on exam today and has been monitoring her weights daily.   3.  Palpitations: -She reports that her palpitations have been better controlled since her stress level has been reduced with her husband being home  from the hospital. -She can continue as needed Toprol-XL 25 mg for increased palpitations.   4.  Morbid obesity: -BMI is 40.38 kg/m  -He was advised to follow a low-sodium heart healthy diet.    Disposition: Follow-up with Christell Constant, MD or APP in 6 months    Medication Adjustments/Labs and Tests Ordered: Current medicines are reviewed at length with the patient today.  Concerns regarding medicines are outlined above.   Signed, Napoleon Form, Leodis Rains, NP 04/01/2023, 11:33 AM [facility]

## 2023-04-01 ENCOUNTER — Encounter: Payer: Self-pay | Admitting: Nurse Practitioner

## 2023-04-01 ENCOUNTER — Ambulatory Visit: Payer: BC Managed Care – PPO | Attending: Nurse Practitioner | Admitting: Nurse Practitioner

## 2023-04-01 VITALS — BP 128/84 | HR 90 | Ht 66.0 in | Wt 250.2 lb

## 2023-04-01 DIAGNOSIS — R002 Palpitations: Secondary | ICD-10-CM

## 2023-04-01 DIAGNOSIS — I5022 Chronic systolic (congestive) heart failure: Secondary | ICD-10-CM

## 2023-04-01 DIAGNOSIS — R0609 Other forms of dyspnea: Secondary | ICD-10-CM | POA: Diagnosis not present

## 2023-04-01 NOTE — Patient Instructions (Signed)
Medication Instructions:  Your physician recommends that you continue on your current medications as directed. Please refer to the Current Medication list given to you today. *If you need a refill on your cardiac medications before your next appointment, please call your pharmacy*   Lab Work: None ordered If you have labs (blood work) drawn today and your tests are completely normal, you will receive your results only by: MyChart Message (if you have MyChart) OR A paper copy in the mail If you have any lab test that is abnormal or we need to change your treatment, we will call you to review the results.   Testing/Procedures: Your physician has requested that you have an echocardiogram. Echocardiography is a painless test that uses sound waves to create images of your heart. It provides your doctor with information about the size and shape of your heart and how well your heart's chambers and valves are working. This procedure takes approximately one hour. There are no restrictions for this procedure. Please do NOT wear cologne, perfume, aftershave, or lotions (deodorant is allowed). Please arrive 15 minutes prior to your appointment time.   Follow-Up: At [facility], you and your health needs are our priority.  As part of our continuing mission to provide you with exceptional heart care, we have created designated Provider Care Teams.  These Care Teams include your primary Cardiologist (physician) and Advanced Practice Providers (APPs -  Physician Assistants and Nurse Practitioners) who all work together to provide you with the care you need, when you need it.  We recommend signing up for the patient portal called "MyChart".  Sign up information is provided on this After Visit Summary.  MyChart is used to connect with patients for Virtual Visits (Telemedicine).  Patients are able to view lab/test results, encounter notes, upcoming appointments, etc.  Non-urgent messages can be sent to  your provider as well.   To learn more about what you can do with MyChart, go to [email or web address].    Your next appointment:   6 month(s)  Provider:   Christell Constant, MD     Other Instructions

## 2023-05-26 ENCOUNTER — Ambulatory Visit (HOSPITAL_COMMUNITY): Payer: BC Managed Care – PPO | Attending: Nurse Practitioner

## 2023-05-26 ENCOUNTER — Other Ambulatory Visit: Payer: Self-pay

## 2023-05-26 DIAGNOSIS — R0609 Other forms of dyspnea: Secondary | ICD-10-CM

## 2023-05-26 DIAGNOSIS — R002 Palpitations: Secondary | ICD-10-CM | POA: Diagnosis not present

## 2023-05-26 DIAGNOSIS — I5022 Chronic systolic (congestive) heart failure: Secondary | ICD-10-CM

## 2023-05-26 LAB — ECHOCARDIOGRAM COMPLETE
Area-P 1/2: 3.86 cm2
MV M vel: 4.94 m/s
MV Peak grad: 97.6 mmHg
S' Lateral: 2.9 cm

## 2023-05-26 MED ORDER — ENTRESTO 24-26 MG PO TABS: 1.0000 | ORAL_TABLET | Freq: Two times a day (BID) | ORAL | 2 refills | Status: DC

## 2023-05-31 ENCOUNTER — Telehealth: Payer: Self-pay

## 2023-05-31 ENCOUNTER — Other Ambulatory Visit (HOSPITAL_COMMUNITY): Payer: Self-pay

## 2023-05-31 NOTE — Telephone Encounter (Signed)
Pharmacy Patient Advocate Encounter   Received notification from [facility]  that prior authorization for ENTRESTO 24-26MG  is required/requested.   PA submitted to [facility] via CoverMyMeds Key or (Medicaid) confirmation # [id number]   Status is pending

## 2023-06-03 NOTE — Telephone Encounter (Signed)
Initial p/a for entresto ''denied due to     A p/a has been resubmitted for consideridation.new key [id number]

## 2023-06-08 MED ORDER — ENTRESTO 24-26 MG PO TABS: 1.0000 | ORAL_TABLET | Freq: Two times a day (BID) | ORAL | 3 refills | Status: DC

## 2023-06-08 NOTE — Telephone Encounter (Signed)
Pharmacy Patient Advocate Encounter  Prior Authorization for ENTRESTO 24-26MG  has been APPROVED by [facility] from 6.27.24 to 6.26.25.

## 2023-06-08 NOTE — Addendum Note (Signed)
Addended by: Antoria Lanza E on: 06/08/2023 04:24 PM   Modules accepted: Orders

## 2023-06-08 NOTE — Telephone Encounter (Signed)
Refill sent in

## 2023-06-11 ENCOUNTER — Ambulatory Visit: Payer: BC Managed Care – PPO | Attending: Internal Medicine

## 2023-06-11 DIAGNOSIS — R0609 Other forms of dyspnea: Secondary | ICD-10-CM

## 2023-06-11 DIAGNOSIS — I5022 Chronic systolic (congestive) heart failure: Secondary | ICD-10-CM

## 2023-06-12 LAB — BASIC METABOLIC PANEL
BUN/Creatinine Ratio: 14 (ref 9–23)
BUN: 11 mg/dL (ref 6–24)
CO2: 24 mmol/L (ref 20–29)
Calcium: 9.7 mg/dL (ref 8.7–10.2)
Chloride: 102 mmol/L (ref 96–106)
Creatinine, Ser: 0.79 mg/dL (ref 0.57–1.00)
Glucose: 165 mg/dL — ABNORMAL HIGH (ref 70–99)
Potassium: 4.1 mmol/L (ref 3.5–5.2)
Sodium: 139 mmol/L (ref 134–144)
eGFR: 88 mL/min/{1.73_m2} (ref >59)

## 2023-06-18 ENCOUNTER — Other Ambulatory Visit: Payer: Self-pay

## 2023-06-18 ENCOUNTER — Other Ambulatory Visit: Payer: BC Managed Care – PPO

## 2023-11-21 ENCOUNTER — Emergency Department (HOSPITAL_COMMUNITY)
Admission: EM | Admit: 2023-11-21 | Discharge: 2023-11-21 | Disposition: A | Discharge status: Home / Self Care | Payer: BC Managed Care – PPO | Attending: Emergency Medicine | Admitting: Emergency Medicine

## 2023-11-21 ENCOUNTER — Encounter (HOSPITAL_COMMUNITY): Payer: Self-pay

## 2023-11-21 ENCOUNTER — Emergency Department (HOSPITAL_COMMUNITY): Payer: BC Managed Care – PPO

## 2023-11-21 ENCOUNTER — Other Ambulatory Visit: Payer: Self-pay

## 2023-11-21 DIAGNOSIS — R1012 Left upper quadrant pain: Secondary | ICD-10-CM | POA: Diagnosis present

## 2023-11-21 DIAGNOSIS — Z91040 Latex allergy status: Secondary | ICD-10-CM | POA: Insufficient documentation

## 2023-11-21 DIAGNOSIS — I509 Heart failure, unspecified: Secondary | ICD-10-CM | POA: Diagnosis not present

## 2023-11-21 DIAGNOSIS — Z1152 Encounter for screening for COVID-19: Secondary | ICD-10-CM | POA: Insufficient documentation

## 2023-11-21 DIAGNOSIS — I110 Hypertensive heart disease with heart failure: Secondary | ICD-10-CM | POA: Diagnosis not present

## 2023-11-21 HISTORY — DX: Heart failure, unspecified: I50.9

## 2023-11-21 HISTORY — DX: Essential (primary) hypertension: I10

## 2023-11-21 LAB — CBC
HCT: 46 % (ref 36.0–46.0)
Hemoglobin: 15.6 g/dL — ABNORMAL HIGH (ref 12.0–15.0)
MCH: 31.1 pg (ref 26.0–34.0)
MCHC: 33.9 g/dL (ref 30.0–36.0)
MCV: 91.8 fL (ref 80.0–100.0)
Platelets: 242 10*3/uL (ref 150–400)
RBC: 5.01 MIL/uL (ref 3.87–5.11)
RDW: 12 % (ref 11.5–15.5)
WBC: 7.6 10*3/uL (ref 4.0–10.5)
nRBC: 0 % (ref 0.0–0.2)

## 2023-11-21 LAB — RESP PANEL BY RT-PCR (RSV, FLU A&B, COVID)  RVPGX2
Influenza A by PCR: NEGATIVE
Influenza B by PCR: NEGATIVE
Resp Syncytial Virus by PCR: NEGATIVE
SARS Coronavirus 2 by RT PCR: NEGATIVE

## 2023-11-21 LAB — COMPREHENSIVE METABOLIC PANEL
ALT: 21 U/L (ref 0–44)
AST: 24 U/L (ref 15–41)
Albumin: 4 g/dL (ref 3.5–5.0)
Alkaline Phosphatase: 56 U/L (ref 38–126)
Anion gap: 11 (ref 5–15)
BUN: 11 mg/dL (ref 6–20)
CO2: 20 mmol/L — ABNORMAL LOW (ref 22–32)
Calcium: 9.5 mg/dL (ref 8.9–10.3)
Chloride: 105 mmol/L (ref 98–111)
Creatinine, Ser: 0.86 mg/dL (ref 0.44–1.00)
GFR, Estimated: >60 mL/min (ref >60)
Glucose, Bld: 99 mg/dL (ref 70–99)
Potassium: 4.4 mmol/L (ref 3.5–5.1)
Sodium: 136 mmol/L (ref 135–145)
Total Bilirubin: 0.6 mg/dL (ref <1.2)
Total Protein: 6.6 g/dL (ref 6.5–8.1)

## 2023-11-21 LAB — TROPONIN I (HIGH SENSITIVITY)
Troponin I (High Sensitivity): 4 ng/L (ref <18)
Troponin I (High Sensitivity): 5 ng/L (ref <18)

## 2023-11-21 LAB — BRAIN NATRIURETIC PEPTIDE: B Natriuretic Peptide: 40.7 pg/mL (ref 0.0–100.0)

## 2023-11-21 LAB — D-DIMER, QUANTITATIVE: D-Dimer, Quant: 0.47 ug{FEU}/mL (ref 0.00–0.50)

## 2023-11-21 LAB — LIPASE, BLOOD: Lipase: 32 U/L (ref 11–51)

## 2023-11-21 MED ORDER — HYDROMORPHONE HCL 1 MG/ML IJ SOLN
0.5000 mg | Freq: Once | INTRAMUSCULAR | Status: AC
  Administered 2023-11-21: 0.5 mg via INTRAVENOUS
  Filled 2023-11-21: qty 1

## 2023-11-21 MED ORDER — PANTOPRAZOLE SODIUM 40 MG IV SOLR
40.0000 mg | Freq: Once | INTRAVENOUS | Status: AC
  Administered 2023-11-21: 40 mg via INTRAVENOUS
  Filled 2023-11-21: qty 10

## 2023-11-21 MED ORDER — METHOCARBAMOL 500 MG PO TABS
1000.0000 mg | ORAL_TABLET | Freq: Once | ORAL | Status: AC
  Administered 2023-11-21: 1000 mg via ORAL
  Filled 2023-11-21: qty 2

## 2023-11-21 MED ORDER — HYDROCODONE-ACETAMINOPHEN 5-325 MG PO TABS: 1.0000 | ORAL_TABLET | Freq: Four times a day (QID) | ORAL | 0 refills | Status: DC | PRN

## 2023-11-21 MED ORDER — DICYCLOMINE HCL 10 MG PO CAPS
10.0000 mg | ORAL_CAPSULE | Freq: Once | ORAL | Status: AC
  Administered 2023-11-21: 10 mg via ORAL
  Filled 2023-11-21: qty 1

## 2023-11-21 MED ORDER — METHOCARBAMOL 500 MG PO TABS: 500.0000 mg | ORAL_TABLET | Freq: Two times a day (BID) | ORAL | 0 refills | Status: DC

## 2023-11-21 MED ORDER — ALUM & MAG HYDROXIDE-SIMETH 200-200-20 MG/5ML PO SUSP
30.0000 mL | Freq: Once | ORAL | Status: AC
  Administered 2023-11-21: 30 mL via ORAL
  Filled 2023-11-21: qty 30

## 2023-11-21 MED ORDER — DICYCLOMINE HCL 20 MG PO TABS: 20.0000 mg | ORAL_TABLET | Freq: Two times a day (BID) | ORAL | 0 refills | Status: DC

## 2023-11-21 MED ORDER — OMEPRAZOLE 20 MG PO CPDR: 20.0000 mg | DELAYED_RELEASE_CAPSULE | Freq: Every day | ORAL | 0 refills | Status: DC

## 2023-11-21 NOTE — ED Triage Notes (Signed)
Pt c.o LUQ pain x 5 days with some SOB. States today the pain is the worst

## 2023-11-21 NOTE — Discharge Instructions (Addendum)
As we discussed, your workup in the ER was reassuring for acute findings.  Laboratory evaluation, x-ray, and CT imaging did not reveal any emergent cause of your symptoms.  Given this, I have given you a prescription for Prilosec which is an antacid medication for you to take 30 minutes prior to your first meal of the day every day.  I have also given you Bentyl which is a medication you can take as prescribed as needed for stomach cramps.  I have also given you Robaxin to help with any muscular etiology of your symptoms.  Additionally, have given you a few doses of Vicodin which is a narcotic pain medication for you to take as prescribed as needed for severe pain only.  Do not drive or operate machinery while taking this medication or the Robaxin as they can be sedating.  Additionally, have given you a referral to a GI specialist to follow-up with.  They should call you in the next 72 hours.  I also recommend that you follow-up closely with your primary doctor.  Return if development of any new or worsening symptoms.

## 2023-11-21 NOTE — ED Provider Notes (Signed)
[place] [facility] AT [facility] Provider Note   CSN: [id number] Arrival date & time: 11/21/23  1410     History  Chief Complaint  Patient presents with   Abdominal Pain    Julie Hampton is a 57 y.o. female.  Patient with history of SVT, seizures, morbid obesity, heart failure presents today with complaints of abdominal pain. She states that same is located in her LUQ and does not radiate. States she originally thought she might be constipated, however she took a laxative and had a bowel movement without improvement in her pain. Endorses associated shortness of breath. States her symptoms began 5 days ago but became severe today. Nothing seems to make her pain worse, however does feel like it improves with direct pressure. Pain is reproduced to palpation. No fevers, chills, nausea, vomiting, or diarrhea.  No history of similar symptoms previously. Denies history of abdominal surgeries. No leg pain or leg swelling. No PND or orthopnea. Does state she feels like she is gaining weight unintentionally. Denies urinary symptoms.   The history is provided by the patient. No language interpreter was used.  Abdominal Pain      Home Medications Prior to Admission medications   Medication Sig Start Date End Date Taking? Authorizing Provider  albuterol (VENTOLIN HFA) 108 (90 Base) MCG/ACT inhaler Inhale 2 puffs into the lungs every 6 (six) hours as needed for wheezing or shortness of breath. 04/05/22   [provider]  dapagliflozin propanediol (FARXIGA) 10 MG TABS tablet Take 1 tablet (10 mg total) by mouth daily before breakfast. 02/08/23   Chandrasekhar, Mahesh A, MD  EPINEPHrine (EPIPEN 2-PAK) 0.3 mg/0.3 mL IJ SOAJ injection Inject 0.3 mg into the muscle as needed for anaphylaxis. 05/16/13   [provider]  levETIRAcetam (KEPPRA XR) 500 MG 24 hr tablet Take 5 tablets (2,500 mg total) by mouth at bedtime. 02/16/23   Lomax, Amy, NP  metoprolol succinate  (TOPROL XL) 25 MG 24 hr tablet Take 1 tablet (25 mg total) by mouth daily. Stop taking carvedilol 3.125 03/02/23   Gaston Islam., NP      Allergies    Iodinated contrast media, Penicillins, Thallous chloride tl 201, Morphine and codeine, Other, Alpha-gal, Beef (bovine) protein, Latex, Strawberry extract, and Thallium    Review of Systems   Review of Systems  Gastrointestinal:  Positive for abdominal pain.  All other systems reviewed and are negative.   Physical Exam Updated Vital Signs BP 126/62   Pulse 67   Temp (!) 97.4 F (36.3 C)   Resp 12   Ht 5\' 6"  (1.676 m)   Wt 113.4 kg   SpO2 98%   BMI 40.35 kg/m  Physical Exam Vitals and nursing note reviewed.  Constitutional:      General: She is not in acute distress.    Appearance: Normal appearance. She is normal weight. She is not ill-appearing, toxic-appearing or diaphoretic.  HENT:     Head: Normocephalic and atraumatic.  Cardiovascular:     Rate and Rhythm: Normal rate.  Pulmonary:     Effort: Pulmonary effort is normal. No respiratory distress.  Abdominal:     General: Abdomen is flat.     Palpations: Abdomen is soft.     Tenderness: Negative signs include Murphy's sign.       Comments: No overlying skin changes  Musculoskeletal:        General: Normal range of motion.     Cervical back: Normal range of  motion.  Skin:    General: Skin is warm and dry.  Neurological:     General: No focal deficit present.     Mental Status: She is alert.  Psychiatric:        Mood and Affect: Mood normal.        Behavior: Behavior normal.     ED Results / Procedures / Treatments   Labs (all labs ordered are listed, but only abnormal results are displayed) Labs Reviewed  CBC - Abnormal; Notable for the following components:      Result Value   Hemoglobin 15.6 (*)    All other components within normal limits  COMPREHENSIVE METABOLIC PANEL - Abnormal; Notable for the following components:   CO2 20 (*)    All other  components within normal limits  RESP PANEL BY RT-PCR (RSV, FLU A&B, COVID)  RVPGX2  LIPASE, BLOOD  BRAIN NATRIURETIC PEPTIDE  D-DIMER, QUANTITATIVE  TROPONIN I (HIGH SENSITIVITY)  TROPONIN I (HIGH SENSITIVITY)    EKG EKG Interpretation Date/Time:  Sunday November 21 2023 14:31:47 EST Ventricular Rate:  82 PR Interval:  150 QRS Duration:  84 QT Interval:  392 QTC Calculation: 457 R Axis:   41  Text Interpretation: Normal sinus rhythm Low voltage QRS Borderline ECG When compared with ECG of 10-Aug-2022 12:52, PREVIOUS ECG IS PRESENT Confirmed by Ernie Avena ([id number]) on 11/21/2023 3:53:47 PM  Radiology CT ABDOMEN PELVIS WO CONTRAST Result Date: 11/21/2023 CLINICAL DATA:  Left upper quadrant pain EXAM: CT ABDOMEN AND PELVIS WITHOUT CONTRAST TECHNIQUE: Multidetector CT imaging of the abdomen and pelvis was performed following the standard protocol without IV contrast. RADIATION DOSE REDUCTION: This exam was performed according to the departmental dose-optimization program which includes automated exposure control, adjustment of the mA and/or kV according to patient size and/or use of iterative reconstruction technique. COMPARISON:  None Available. FINDINGS: Lower chest: Lung bases are clear Hepatobiliary: Hepatic steatosis. Gallstones. No biliary dilatation Pancreas: Unremarkable. No pancreatic ductal dilatation or surrounding inflammatory changes. Spleen: Normal in size without focal abnormality. Adrenals/Urinary Tract: Adrenal glands are normal. No hydronephrosis. Small parapelvic left renal cyst, no imaging follow-up is recommended. The bladder is normal Stomach/Bowel: Stomach is within normal limits. Appendectomy. No evidence of bowel wall thickening, distention, or inflammatory changes. 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 Vascular/Lymphatic: Mild aortic atherosclerosis. No aneurysm. No suspicious lymph nodes. Reproductive: Uterus and bilateral adnexa are  unremarkable. Other: No abdominal wall hernia or abnormality. No abdominopelvic ascites. Musculoskeletal: No acute or significant osseous findings. IMPRESSION: 1. No CT evidence for acute intra-abdominal or pelvic abnormality. 2. Hepatic steatosis. 3. Gallstones. 4. 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 5. Aortic atherosclerosis. Aortic Atherosclerosis (ICD10-I70.0). Electronically Signed   By: Jasmine Pang M.D.   On: 11/21/2023 19:05   DG Chest 2 View Result Date: 11/21/2023 CLINICAL DATA:  Left chest pain EXAM: CHEST - 2 VIEW COMPARISON:  08/10/2022 FINDINGS: The heart size and mediastinal contours are within normal limits. Both lungs are clear. The visualized skeletal structures are unremarkable. IMPRESSION: No active cardiopulmonary disease. Electronically Signed   By: Duanne Guess D.O.   On: 11/21/2023 14:53    Procedures Procedures    Medications Ordered in ED Medications  dicyclomine (BENTYL) capsule 10 mg (has no administration in time range)  methocarbamol (ROBAXIN) tablet 1,000 mg (has no administration in time range)  HYDROmorphone (DILAUDID) injection 0.5 mg (0.5 mg Intravenous Given 11/21/23 1653)  HYDROmorphone (DILAUDID) injection 0.5 mg (0.5 mg Intravenous Given 11/21/23 1808)  alum & mag hydroxide-simeth (MAALOX/MYLANTA) 200-200-20 MG/5ML suspension 30 mL (30 mLs Oral Given 11/21/23 1941)  pantoprazole (PROTONIX) injection 40 mg (40 mg Intravenous Given 11/21/23 1943)    ED Course/ Medical Decision Making/ A&P                                 Medical Decision Making Amount and/or Complexity of Data Reviewed Labs: ordered. Radiology: ordered.  Risk Prescription drug management.   This patient is a 57 y.o. female who presents to the ED for concern of LUQ abdominal pain, this involves an extensive number of treatment options, and is a complaint that carries with it a high risk of complications and morbidity. The emergent differential  diagnosis prior to evaluation includes, but is not limited to,  ACS, pericarditis, myocarditis, aortic dissection, PE, pneumothorax, esophageal spasm or rupture, chronic angina, pneumonia, bronchitis, GERD, reflux/PUD, biliary disease, pancreatitis, costochondritis, anxiety, malignancy   This is not an exhaustive differential.   Past Medical History / Co-morbidities / Social History:  has a past medical history of Chest pain, CHF (congestive heart failure) (HCC), Hypertension, Leg swelling, Near syncope, Obesity, Palpitations, and Seizures (HCC).  Additional history: Chart reviewed. Pertinent results include: most recent echo showed EF 45%-50%  Physical Exam: Physical exam performed. The pertinent findings include: TTP LUQ  Lab Tests: I ordered, and personally interpreted labs.  The pertinent results include: No acute laboratory abnormalities.  D-dimer WNL.  BMP WNL.  Delta troponin negative and flat.   Imaging Studies: I ordered imaging studies including CXR, CT abdomen pelvis. I independently visualized and interpreted imaging which showed   CXR: NAD  CT:  1. No CT evidence for acute intra-abdominal or pelvic abnormality. 2. Hepatic steatosis. 3. Gallstones. 4. 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 5. Aortic atherosclerosis.  I agree with the radiologist interpretation.   Cardiac Monitoring:  The patient was maintained on a cardiac monitor.  My attending physician Dr. Karene Fry viewed and interpreted the cardiac monitored which showed an underlying rhythm of: no STEMI. I agree with this interpretation.   Medications: I ordered medication including dilaudid, GI cocktail, bentyl, robaxin, protonix  for pain. Reevaluation of the patient after these medicines showed that the patient improved. I have reviewed the patients home medicines and have made adjustments as needed.   Disposition: After consideration of the diagnostic results and the patients response  to treatment, I feel that emergency department workup does not suggest an emergent condition requiring admission or immediate intervention beyond what has been performed at this time. The plan is: discharge with close outpatient follow-up and return precautions. Her workup is benign. Negative troponin, d dimer, and BNP. Also no leukocytosis. CT imaging unremarkable. Suspect gastritis as symptom etiology. Will treat with daily PPI, and bentyl. Pain could also be muscular and will therefore give Robaxin.  Will also send for a few doses of Vicodin to help for symptomatic relief given patient's level discomfort.  PDMP reviewed.  Patient advised not to drive or operate heavy machinery while taking this medication.  She can eat and drink without worsening pain.  Will give referral to GI for follow-up.  Recommend close PCP follow-up as well. Evaluation and diagnostic testing in the emergency department does not suggest an emergent condition requiring admission or immediate intervention beyond what has been performed at this time.  Plan for discharge with close PCP follow-up.  Patient is understanding and amenable with plan,  educated on red flag symptoms that would prompt immediate return.  Patient discharged in stable condition.   I discussed this case with my attending physician Dr. Karene Fry who cosigned this note including patient's presenting symptoms, physical exam, and planned diagnostics and interventions. Attending physician stated agreement with plan or made changes to plan which were implemented.    Final Clinical Impression(s) / ED Diagnoses Final diagnoses:  Left upper quadrant abdominal pain    Rx / DC Orders ED Discharge Orders          Ordered    omeprazole (PRILOSEC) 20 MG capsule  Daily        11/21/23 2058    dicyclomine (BENTYL) 20 MG tablet  2 times daily        11/21/23 2058    methocarbamol (ROBAXIN) 500 MG tablet  2 times daily        11/21/23 2058    HYDROcodone-acetaminophen  (NORCO/VICODIN) 5-325 MG tablet  Every 6 hours PRN        11/21/23 2058    Ambulatory referral to Gastroenterology        11/21/23 2059          An After Visit Summary was printed and given to the patient.     Vear Clock 11/21/23 2100    Ernie Avena, MD 11/21/23 2116

## 2023-11-25 ENCOUNTER — Other Ambulatory Visit: Payer: Self-pay | Admitting: Internal Medicine

## 2024-01-19 NOTE — Progress Notes (Signed)
Chief Complaint  Patient presents with   Follow-up    Pt in 1 alone Pt here for seizure f/u Pt states no seizure since last office visit     HISTORY OF PRESENT ILLNESS:  01/25/24 ALL:  Julie Hampton returns for follow up for seizures. She continues levetiracetam XR 2500mg  at bedtime. She is tolerating med well. No seizure activity. She continues close follow up with cardiology and PCP. Last EF 45%. She is working with a Health and safety inspector and has lost 13lbs.   01/21/2023 ALL:  Julie Hampton is a 58 y.o. female here today for follow up for seizures. She continues levetiracetam XR 2500mg  at bedtime. She reports doing well. No seizure activity. She has been followed closely by cardiology for CHF. BP is monitored closely. 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   12/31/21 ALL (Mychart): Julie Hampton is a 58 y.o. female here today for follow up for seizure disorder. She continues to do well on levetiracetam 1000mg  in the am and 1500mg  in the pm. No seizures, last seizure 2008. She is tolerating medication well. She is followed by PCP annually. Labs are reportedly normal. She is feeling well and without complaints.    01/06/21 ALL (Mychart): She returns for seizure follow up. She continues 1000/1500. Last seizure 2008. She is doing very well. She denies any changes in medical history or concerns today. She did have Covid last year and was fairly sick for about 2 months. Fortunately, she has fully recovered. She is vaccinated.    10/03/2019 ALL (Mychart):  Julie Hampton is a 58 y.o. female here today for follow up for seizure. She continues Keppra XR 1000mg  in am and 1500mg  in pm. Last seizure in 2008.  She is doing very well on Keppra with no obvious adverse effects.  She was recently diagnosed with COVID-19.  She is recovering well.  She is seen regularly by her PCP.  She is working full-time as a Runner, broadcasting/film/video, currently working remotely.  She is able to drive and perform all  ADLs.   REVIEW OF SYSTEMS: Out of a complete 14 system review of symptoms, the patient complains only of the following symptoms, fatigue, right knee pain and all other reviewed systems are negative.   ALLERGIES: Allergies  Allergen Reactions   Iodinated Contrast Media Hives and Shortness Of Breath   Penicillins Anaphylaxis    Did it involve swelling of the face/tongue/throat, SOB, or low BP? Yes Did it involve sudden or severe rash/hives, skin peeling, or any reaction on the inside of your mouth or nose? Yes Did you need to seek medical attention at a hospital or doctor's office? Yes When did it last happen?   Over 20 Years Ago    If all above answers are "NO", may proceed with cephalosporin use.     Thallous Chloride Tl 201 Hives    Contrast dye for cardiac stress test.  Had increased heart rate, hives.   Morphine And Codeine Other (See Comments)    L leg (paralysis). Lasted 18-24 hours.   L leg (paralysis). Lasted 18-24 hours.     Other     Banana peppers   Alpha-Gal Hives and Palpitations    Other reaction(s): Wheezing   Beef (Bovine) Protein Rash   Latex Dermatitis    Other reaction(s): Not available   Strawberry Extract Rash    Other reaction(s): Not available   Thallium Hives    Contrast dye for cardiac stress  test.  Had increased heart rate, hives.     HOME MEDICATIONS: Outpatient Medications Prior to Visit  Medication Sig Dispense Refill   albuterol (VENTOLIN HFA) 108 (90 Base) MCG/ACT inhaler Inhale 2 puffs into the lungs as needed for wheezing or shortness of breath.     dapagliflozin propanediol (FARXIGA) 10 MG TABS tablet Take 1 tablet (10 mg total) by mouth daily before breakfast. 30 tablet 11   dicyclomine (BENTYL) 20 MG tablet Take 1 tablet (20 mg total) by mouth 2 (two) times daily. (Patient taking differently: Take 20 mg by mouth as needed.) 20 tablet 0   EPINEPHrine (EPIPEN 2-PAK) 0.3 mg/0.3 mL IJ SOAJ injection Inject 0.3 mg into the muscle as needed for  anaphylaxis.     metoprolol succinate (TOPROL-XL) 25 MG 24 hr tablet TAKE 1 TABLET (25 MG TOTAL) BY MOUTH DAILY. STOP TAKING CARVEDILOL 3.125 90 tablet 0   levETIRAcetam (KEPPRA XR) 500 MG 24 hr tablet Take 5 tablets (2,500 mg total) by mouth at bedtime. 450 tablet 3   HYDROcodone-acetaminophen (NORCO/VICODIN) 5-325 MG tablet Take 1 tablet by mouth every 6 (six) hours as needed for severe pain (pain score 7-10). 8 tablet 0   methocarbamol (ROBAXIN) 500 MG tablet Take 1 tablet (500 mg total) by mouth 2 (two) times daily. 20 tablet 0   omeprazole (PRILOSEC) 20 MG capsule Take 1 capsule (20 mg total) by mouth daily for 14 days. 14 capsule 0   No facility-administered medications prior to visit.     PAST MEDICAL HISTORY: Past Medical History:  Diagnosis Date   Chest pain    CHF (congestive heart failure) (HCC)    Hypertension    Leg swelling    Near syncope    Obesity    Palpitations    Seizures (HCC)      PAST SURGICAL HISTORY: Past Surgical History:  Procedure Laterality Date   APPENDECTOMY  01/2011   COLONOSCOPY     removed pre-cancerous polyps   KNEE SURGERY Right 12/08/2016   KNEE SURGERY  10/2022     FAMILY HISTORY: Family History  Problem Relation Age of Onset   Colon cancer Mother    Heart failure Father    Breast cancer Other        Aunt   Epilepsy Sister    Skin cancer Sister      SOCIAL HISTORY: Social History   Socioeconomic History   Marital status: Married    Spouse name: Not on file   Number of children: Not on file   Years of education: Not on file   Highest education level: Not on file  Occupational History   Not on file  Tobacco Use   Smoking status: Former   Smokeless tobacco: Never   Tobacco comments:    Quit- 1995  Vaping Use   Vaping status: Not on file  Substance and Sexual Activity   Alcohol use: No    Comment: Quit in 1989   Drug use: No   Sexual activity: Not on file  Other Topics Concern   Not on file  Social History  Narrative   Patient is married and lives with her husband and son.   Patient Vice Principal    Patient is right-handed.   Patient drinks one cup of coffee daily.   Pt works    Museum/gallery exhibitions officer: Not on [employer] Insecurity: Not on file  Transportation Needs: Not on file  Physical Activity: Not on  file  Stress: Not on file  Social Connections: Not on file  Intimate Partner Violence: Not on file     PHYSICAL EXAM  Vitals:   01/25/24 0844  BP: 125/77  Pulse: 75  Weight: 239 lb (108.4 kg)  Height: 5\' 6"  (1.676 m)    Body mass index is 38.58 kg/m.  Generalized: Well developed, in no acute distress  Cardiology: normal rate and rhythm, no murmur auscultated  Respiratory: clear to auscultation bilaterally    Neurological examination  Mentation: Alert oriented to time, place, history taking. Follows all commands speech and language fluent Cranial nerve II-XII: Pupils were equal round reactive to light. Extraocular movements were full, visual field were full on confrontational test. Facial sensation and strength were normal. Head turning and shoulder shrug  were normal and symmetric. Motor: The motor testing reveals 5 over 5 strength of all 4 extremities. Good symmetric motor tone is noted throughout.   Gait and station: Gait is arthritic, stable without assistive device    DIAGNOSTIC DATA (LABS, IMAGING, TESTING) - I reviewed patient records, labs, notes, testing and imaging myself where available.  Lab Results  Component Value Date   WBC 7.6 11/21/2023   HGB 15.6 (H) 11/21/2023   HCT 46.0 11/21/2023   MCV 91.8 11/21/2023   PLT 242 11/21/2023      Component Value Date/Time   NA 136 11/21/2023 1424   NA 139 06/11/2023 1006   K 4.4 11/21/2023 1424   CL 105 11/21/2023 1424   CO2 20 (L) 11/21/2023 1424   GLUCOSE 99 11/21/2023 1424   BUN 11 11/21/2023 1424   BUN 11 06/11/2023 1006   CREATININE 0.86 11/21/2023 1424   CALCIUM 9.5  11/21/2023 1424   PROT 6.6 11/21/2023 1424   PROT 6.7 04/06/2016 1005   ALBUMIN 4.0 11/21/2023 1424   ALBUMIN 4.5 04/06/2016 1005   AST 24 11/21/2023 1424   ALT 21 11/21/2023 1424   ALKPHOS 56 11/21/2023 1424   BILITOT 0.6 11/21/2023 1424   BILITOT 0.3 04/06/2016 1005   GFRNONAA >60 11/21/2023 1424   GFRAA >60 09/05/2019 1715   Lab Results  Component Value Date   TRIG 151 (H) 09/05/2019   No results found for: "HGBA1C" No results found for: "VITAMINB12" No results found for: "TSH"      No data to display               No data to display           ASSESSMENT AND PLAN  58 y.o. year old female  has a past medical history of Chest pain, CHF (congestive heart failure) (HCC), Hypertension, Leg swelling, Near syncope, Obesity, Palpitations, and Seizures (HCC). here with    Seizure disorder (HCC)  Julie Hampton is doing well. She will continue levetiracetam XR 2500mg  daily at bedtime. Seizure precautions advised. Healthy lifestyle habits encouraged. She will follow up with PCP and care team as directed. She will return to see me in 1 year, sooner if needed. She verbalizes understanding and agreement with this plan.   No orders of the defined types were placed in this encounter.    Meds ordered this encounter  Medications   levETIRAcetam (KEPPRA XR) 500 MG 24 hr tablet    Sig: Take 5 tablets (2,500 mg total) by mouth at bedtime.    Dispense:  450 tablet    Refill:  3    Supervising Provider:   Anson Fret [[id number]]     Julie Hampton Nicholas Lose,  MSN, FNP-C 01/25/2024, 9:12 AM  [facility] [address] [place], [place] [address] [phone]

## 2024-01-19 NOTE — Patient Instructions (Signed)
Below is our plan:  We will continue levetiracetam 2500mg  at bedtime.   Please make sure you are consistent with timing of seizure medication. I recommend annual visit with primary care provider (PCP) for complete physical and routine blood work. I recommend daily intake of vitamin D (400-800iu) and calcium (800-1000mg ) for bone health. Discuss Dexa screening with PCP.   According to [place] law, you can not drive unless you are seizure / syncope free for at least 6 months and under physician's care.  Please maintain precautions. Do not participate in activities where a loss of awareness could harm you or someone else. No swimming alone, no tub bathing, no hot tubs, no driving, no operating motorized vehicles (cars, ATVs, motocycles, etc), lawnmowers, power tools or firearms. No standing at heights, such as rooftops, ladders or stairs. Avoid hot objects such as stoves, heaters, open fires. Wear a helmet when riding a bicycle, scooter, skateboard, etc. and avoid areas of traffic. Set your water heater to 120 degrees or less.  SUDEP is the sudden, unexpected death of someone with epilepsy, who was otherwise healthy. In SUDEP cases, no other cause of death is found when an autopsy is done. Each year, more than 1 in 1,000 people with epilepsy die from SUDEP. This is the leading cause of death in people with uncontrolled seizures. Until further answers are available, the best way to prevent SUDEP is to lower your risk by controlling seizures. Research has found that people with all types of epilepsy that experience convulsive seizures can be at risk.  Please make sure you are staying well hydrated. I recommend 50-60 ounces daily. Well balanced diet and regular exercise encouraged. Consistent sleep schedule with 6-8 hours recommended.   Please continue follow up with care team as directed.   Follow up with me in 1 year   You may receive a survey regarding today's visit. I encourage you to leave honest feed  back as I do use this information to improve patient care. Thank you for seeing me today!

## 2024-01-25 ENCOUNTER — Encounter: Payer: Self-pay | Admitting: Family Medicine

## 2024-01-25 ENCOUNTER — Ambulatory Visit: Payer: 59 | Admitting: Family Medicine

## 2024-01-25 VITALS — BP 125/77 | HR 75 | Ht 66.0 in | Wt 239.0 lb

## 2024-01-25 DIAGNOSIS — G40909 Epilepsy, unspecified, not intractable, without status epilepticus: Secondary | ICD-10-CM

## 2024-01-25 MED ORDER — LEVETIRACETAM ER 500 MG PO TB24: 2500.0000 mg | ORAL_TABLET | Freq: Every day | ORAL | 3 refills | Status: AC

## 2024-02-08 ENCOUNTER — Other Ambulatory Visit: Payer: Self-pay | Admitting: Internal Medicine

## 2024-02-08 ENCOUNTER — Encounter: Payer: Self-pay | Admitting: Internal Medicine

## 2024-02-08 ENCOUNTER — Other Ambulatory Visit: Payer: Self-pay

## 2024-02-08 MED ORDER — DAPAGLIFLOZIN PROPANEDIOL 10 MG PO TABS: 10.0000 mg | ORAL_TABLET | Freq: Every day | ORAL | 1 refills | Status: DC

## 2024-02-27 ENCOUNTER — Other Ambulatory Visit: Payer: Self-pay | Admitting: Internal Medicine

## 2024-03-26 ENCOUNTER — Other Ambulatory Visit: Payer: Self-pay | Admitting: Internal Medicine

## 2024-04-05 ENCOUNTER — Other Ambulatory Visit: Payer: Self-pay | Admitting: Internal Medicine

## 2024-04-06 ENCOUNTER — Other Ambulatory Visit: Payer: Self-pay | Admitting: Internal Medicine

## 2024-04-21 ENCOUNTER — Other Ambulatory Visit: Payer: Self-pay | Admitting: Internal Medicine

## 2024-04-23 ENCOUNTER — Other Ambulatory Visit: Payer: Self-pay | Admitting: Internal Medicine

## 2024-04-24 ENCOUNTER — Telehealth: Payer: Self-pay | Admitting: Internal Medicine

## 2024-04-24 MED ORDER — DAPAGLIFLOZIN PROPANEDIOL 10 MG PO TABS: 10.0000 mg | ORAL_TABLET | Freq: Every day | ORAL | 1 refills | Status: DC

## 2024-04-24 NOTE — Telephone Encounter (Signed)
 *  STAT* If patient is at the pharmacy, call can be transferred to refill team.   1. Which medications need to be refilled? (please list name of each medication and dose if known)   dapagliflozin  propanediol (FARXIGA ) 10 MG TABS tablet   2. Which pharmacy/location (including street and city if local pharmacy) is medication to be sent to?  [id number] - [place], [place] - [address]    3. Do they need a 30 day or 90 day supply? 30  Patient is scheduled for appointment on 06/30/24 with Chandrasekhar

## 2024-04-24 NOTE — Telephone Encounter (Signed)
 Pt's medication was sent to pt's pharmacy as requested. Confirmation received.

## 2024-06-16 ENCOUNTER — Other Ambulatory Visit: Payer: Self-pay | Admitting: Internal Medicine

## 2024-06-20 ENCOUNTER — Other Ambulatory Visit: Payer: Self-pay | Admitting: Internal Medicine

## 2024-06-30 ENCOUNTER — Ambulatory Visit: Payer: Self-pay | Admitting: Internal Medicine

## 2024-07-23 ENCOUNTER — Other Ambulatory Visit: Payer: Self-pay | Admitting: Internal Medicine

## 2024-09-05 ENCOUNTER — Telehealth: Payer: Self-pay | Admitting: Family Medicine

## 2024-09-05 NOTE — Telephone Encounter (Signed)
 LVM and sent mychart msg informing pt of need to reschedule 01/25/25 appt - NP schedule change

## 2024-09-08 ENCOUNTER — Ambulatory Visit: Payer: Self-pay | Attending: Internal Medicine | Admitting: Internal Medicine

## 2024-09-08 VITALS — BP 120/87 | HR 69 | Ht 66.0 in | Wt 236.0 lb

## 2024-09-08 DIAGNOSIS — R002 Palpitations: Secondary | ICD-10-CM | POA: Diagnosis not present

## 2024-09-08 DIAGNOSIS — I5043 Acute on chronic combined systolic (congestive) and diastolic (congestive) heart failure: Secondary | ICD-10-CM | POA: Diagnosis not present

## 2024-09-08 DIAGNOSIS — I493 Ventricular premature depolarization: Secondary | ICD-10-CM

## 2024-09-08 MED ORDER — PREDNISONE 50 MG PO TABS: ORAL_TABLET | ORAL | 0 refills | Status: DC

## 2024-09-08 MED ORDER — METOPROLOL TARTRATE 100 MG PO TABS: 100.0000 mg | ORAL_TABLET | Freq: Once | ORAL | 0 refills | Status: DC

## 2024-09-08 MED ORDER — DIPHENHYDRAMINE HCL 50 MG PO TABS: ORAL_TABLET | ORAL | 0 refills | Status: DC

## 2024-09-08 NOTE — Progress Notes (Signed)
 Cardiology Office Note:    Date:  09/08/2024   ID:  Julie Hampton, DOB 02/14/1966, MRN [id number]  PCP:  Stephanie Charlene LITTIE, MD   [facility] Providers Cardiologist:  Stanly DELENA Leavens, MD     Referring MD: Stephanie Charlene LITTIE, MD   CC: Follow up   History of Present Illness:    Julie Hampton is a 58 y.o. female with a hx of chest pain, palpitations, and near syncope presenting to establish care.  Has history of SVT. 2023: did echo with LVEF 45% and PVCs with.   Patient notes that she is doing about the same.  Has not started ARB or BB yet. Has one spell of palpitations that was caught on monitor- isolated PVCs. Wonders if her sx are related to prior COVID-19  No chest pain or pressure .  No SOB and no PND/Orthopnea.  No weight gain or leg swelling.      Past Medical History:  Diagnosis Date   Chest pain    CHF (congestive heart failure) (HCC)    Hypertension    Leg swelling    Near syncope    Obesity    Palpitations    Seizures (HCC)     Past Surgical History:  Procedure Laterality Date   APPENDECTOMY  01/2011   COLONOSCOPY     removed pre-cancerous polyps   KNEE SURGERY Right 12/08/2016   KNEE SURGERY  10/2022    Current Medications: Current Meds  Medication Sig   albuterol  (VENTOLIN  HFA) 108 (90 Base) MCG/ACT inhaler Inhale 2 puffs into the lungs as needed for wheezing or shortness of breath.   dapagliflozin  propanediol (FARXIGA ) 10 MG TABS tablet TAKE 1 TABLET BY MOUTH EVERY DAY BEFORE BREAKFAST   EPINEPHrine (EPIPEN 2-PAK) 0.3 mg/0.3 mL IJ SOAJ injection Inject 0.3 mg into the muscle as needed for anaphylaxis.   levETIRAcetam  (KEPPRA  XR) 500 MG 24 hr tablet Take 5 tablets (2,500 mg total) by mouth at bedtime.   metoprolol  succinate (TOPROL -XL) 25 MG 24 hr tablet Take 1 tablet (25 mg total) by mouth daily. KEEP OV.     Allergies:   Iodinated contrast media, Penicillins, Thallous chloride tl 201, Morphine  and codeine, Other, Alpha-gal, Beef  (bovine) protein, Latex, Strawberry extract, and Thallium   Social History   Socioeconomic History   Marital status: Married    Spouse name: Not on file   Number of children: Not on file   Years of education: Not on file   Highest education level: Not on file  Occupational History   Not on file  Tobacco Use   Smoking status: Former   Smokeless tobacco: Never   Tobacco comments:    Quit- 1995  Vaping Use   Vaping status: Not on file  Substance and Sexual Activity   Alcohol use: No    Comment: Quit in 1989   Drug use: No   Sexual activity: Not on file  Other Topics Concern   Not on file  Social History Narrative   Patient is married and lives with her husband and son.   Patient Vice Principal    Patient is right-handed.   Patient drinks one cup of coffee daily.   Pt works    Museum/gallery exhibitions officer: Not on [employer] Insecurity: Not on file  Transportation Needs: Not on file  Physical Activity: Not on file  Stress: Not on file  Social Connections: Not on file  Social: Geophysicist/field seismologist principal married, husband  Family History: The patient's family history includes Breast cancer in an other family member; Colon cancer in her mother; Epilepsy in her sister; Heart failure in her father; Skin cancer in her sister.  ROS:   Please see the history of present illness.     All other systems reviewed and are negative.  EKGs/Labs/Other Studies Reviewed:    The following studies were reviewed today:  EKG:   10/05/22: SR with LVH 08/10/22: NSR 90  Cardiac Studies & Procedures   ______________________________________________________________________________________________     ECHOCARDIOGRAM  ECHOCARDIOGRAM COMPLETE 05/26/2023  Narrative ECHOCARDIOGRAM REPORT    Patient Name:   Julie Hampton  Date of Exam: 05/26/2023 Medical Rec #:  [id number]     Height:       66.0 in Accession #:    [id number]    Weight:       250.2 lb Date of Birth:   September 12, 1966     BSA:          2.199 m Patient Age:    57 years      BP:           128/84 mmHg Patient Gender: F             HR:           71 bpm. Exam Location:  [address]  Procedure: 2D Echo, 3D Echo, Cardiac Doppler and Color Doppler  Indications:    I50.22 CHF (HFrEF)  History:        Patient has prior history of Echocardiogram examinations, most recent 09/28/2022. CHF, Signs/Symptoms:Dyspnea, Edema and Chest Pain; Risk Factors:Family History of Coronary Artery Disease and Former Smoker. Palpitations, Obesity, Heart Failure with mildly reduced EF (prior 45-50%).  Sonographer:    Heather Hawks RDCS Referring Phys: [phone] JACKEE DEL, JR DICK  IMPRESSIONS   1. Left ventricular ejection fraction, by estimation, is 45 to 50%. The left ventricle has mildly decreased function. The left ventricle has no regional wall motion abnormalities. Left ventricular diastolic parameters are consistent with Grade I diastolic dysfunction (impaired relaxation). 2. Right ventricular systolic function is normal. The right ventricular size is normal. There is normal pulmonary artery systolic pressure. The estimated right ventricular systolic pressure is 17.0 mmHg. 3. The mitral valve is normal in structure. Trivial mitral valve regurgitation. 4. The aortic valve is tricuspid. Aortic valve regurgitation is not visualized. No aortic stenosis is present. 5. The inferior vena cava is normal in size with greater than 50% respiratory variability, suggesting right atrial pressure of 3 mmHg.  Comparison(s): No significant change from prior study.  FINDINGS Left Ventricle: Left ventricular ejection fraction, by estimation, is 45 to 50%. The left ventricle has mildly decreased function. The left ventricle has no regional wall motion abnormalities. 3D ejection fraction reviewed and evaluated as part of the interpretation. Alternate measurement of EF is felt to be most reflective of LV function. The left  ventricular internal cavity size was normal in size. There is no left ventricular hypertrophy. Left ventricular diastolic parameters are consistent with Grade I diastolic dysfunction (impaired relaxation).  Right Ventricle: The right ventricular size is normal. No increase in right ventricular wall thickness. Right ventricular systolic function is normal. There is normal pulmonary artery systolic pressure. The tricuspid regurgitant velocity is 1.87 m/s, and with an assumed right atrial pressure of 3 mmHg, the estimated right ventricular systolic pressure is 17.0 mmHg.  Left Atrium: Left atrial size was normal in size.  Right Atrium:  Right atrial size was normal in size.  Pericardium: There is no evidence of pericardial effusion.  Mitral Valve: The mitral valve is normal in structure. Trivial mitral valve regurgitation.  Tricuspid Valve: The tricuspid valve is normal in structure. Tricuspid valve regurgitation is trivial.  Aortic Valve: The aortic valve is tricuspid. Aortic valve regurgitation is not visualized. No aortic stenosis is present.  Pulmonic Valve: The pulmonic valve was normal in structure. Pulmonic valve regurgitation is trivial.  Aorta: The aortic root is normal in size and structure.  Venous: The inferior vena cava is normal in size with greater than 50% respiratory variability, suggesting right atrial pressure of 3 mmHg.  IAS/Shunts: The atrial septum is grossly normal.   LEFT VENTRICLE PLAX 2D LVIDd:         3.60 cm   Diastology LVIDs:         2.90 cm   LV e' medial:    8.16 cm/s LV PW:         0.80 cm   LV E/e' medial:  11.6 LV IVS:        1.00 cm   LV e' lateral:   10.90 cm/s LVOT diam:     2.20 cm   LV E/e' lateral: 8.7 LV SV:         67 LV SV Index:   30 LVOT Area:     3.80 cm  3D Volume EF: 3D EF:        49 % LV EDV:       173 ml LV ESV:       89 ml LV SV:        84 ml  RIGHT VENTRICLE RV Basal diam:  3.30 cm RV S prime:     11.40 cm/s TAPSE  (M-mode): 2.4 cm RVSP:           17.0 mmHg  LEFT ATRIUM             Index        RIGHT ATRIUM           Index LA diam:        4.00 cm 1.82 cm/m   RA Pressure: 3.00 mmHg LA Vol (A2C):   43.2 ml 19.64 ml/m  RA Area:     13.60 cm LA Vol (A4C):   37.0 ml 16.82 ml/m  RA Volume:   36.60 ml  16.64 ml/m LA Biplane Vol: 40.7 ml 18.50 ml/m AORTIC VALVE LVOT Vmax:   73.65 cm/s LVOT Vmean:  50.650 cm/s LVOT VTI:    0.176 m  AORTA Ao Root diam: 3.20 cm Ao Asc diam:  3.60 cm  MITRAL VALVE               TRICUSPID VALVE MV Area (PHT): cm         TR Peak grad:   14.0 mmHg MV Decel Time: 197 msec    TR Vmax:        187.00 cm/s MR Peak grad: 97.6 mmHg    Estimated RAP:  3.00 mmHg MR Mean grad: 69.0 mmHg    RVSP:           17.0 mmHg MR Vmax:      494.00 cm/s MR Vmean:     404.0 cm/s   SHUNTS MV E velocity: 94.55 cm/s  Systemic VTI:  0.18 m MV A velocity: 82.25 cm/s  Systemic Diam: 2.20 cm MV E/A ratio:  1.15  Powell Sorrow MD Electronically signed by Powell Sorrow  MD Signature Date/Time: 05/26/2023/1:06:51 PM    Final    MONITORS  LONG TERM MONITOR (3-14 DAYS) 09/29/2022  Narrative   Patient had a minimum heart rate of 50 bpm, maximum heart rate of 169 bpm, and average heart rate of 92 bpm.   Predominant underlying rhythm was sinus rhythm.   Two runs of NSVT longest lasting 10 beats.   Isolated PACs were rare (<1.0%).   Isolated PVCs were rare (<1.0%).   Triggered and diary events associated with sinus rhythm, PVCs, and PACs.  Rarely symptomatic PVCs and PACs.       ______________________________________________________________________________________________       Recent Labs: 11/21/2023: ALT 21; B Natriuretic Peptide 40.7; BUN 11; Creatinine, Ser 0.86; Hemoglobin 15.6; Platelets 242; Potassium 4.4; Sodium 136  Recent Lipid Panel    Component Value Date/Time   TRIG 151 (H) 09/05/2019 1715    Physical Exam:    VS:  BP 120/87   Pulse 69   Ht 5' 6 (1.676  m)   Wt 236 lb (107 kg)   SpO2 96%   BMI 38.09 kg/m     Wt Readings from Last 3 Encounters:  09/08/24 236 lb (107 kg)  01/25/24 239 lb (108.4 kg)  11/21/23 250 lb (113.4 kg)    Gen: No distress, morbid obesity   Neck: No JVD Cardiac: No Rubs or Gallops, no murmur, RRR +2 radial pulses Respiratory: Clear to auscultation bilaterally, normal effort, normal  respiratory rate GI: Soft, nontender, non-distended  MS: No  edema;  moves all extremities Integument: Skin feels warm Neuro:  At time of evaluation, alert and oriented to person/place/time/situation  Psych: Normal affect, patient feels well presently   ASSESSMENT:    1. Palpitations   2. 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  Concerns regarding medicines are outlined above.  Orders Placed This Encounter  Procedures   EKG 12-Lead   No orders of the defined types were placed in this encounter.   There are no Patient Instructions on file for this visit.   Signed, Stanly DELENA Leavens, MD  09/08/2024 8:53 AM     [facility]

## 2024-09-08 NOTE — Progress Notes (Signed)
 Cardiology Office Note:  .    Date:  09/08/2024  ID:  Julie Hampton, DOB 08-Oct-1966, MRN [id number] PCP: Stephanie Charlene LITTIE, MD  [facility] Providers Cardiologist:  Stanly DELENA Leavens, MD     CC: Heart Failure f/u    History of Present Illness: .    Julie Hampton is a 58 y.o. female with heart failure and supraventricular tachycardia who presents with episodes of nocturnal dyspnea and tachycardia. 2023: Established with me 2024: Improvement in SVT and PVCs; NYHA class improvement, no change in LVEF  She has a history of heart failure with mildly reduced ejection fraction and supraventricular tachycardia. Since her initial visit in 2023, her episodes of tachycardia have improved, now lasting only a minute or two compared to over an hour previously. However, she experiences nocturnal dyspnea approximately three nights a week, characterized by a sensation of her heart working hard but sluggishly, which resolves upon sitting up or rolling over. She notes associated chest pressure during these episodes but denies any chest pain, cough, or significant weight gain.  She is on goal-directed medical therapy, including metoprolol  and Farxiga , but has not tolerated spironolactone . Attempts to introduce a third medication have worsened her symptoms.  In the past month, she experienced a transient episode where the top of her big toe turned purple, prompting a series of tests for a possible blood clot. Although no clot was found, the discoloration resolved after treatment with a blood thinner. She has not experienced similar symptoms since.  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 She has lost weight and notes occasional leg swelling, which she attributes to her heart condition.  Discussed the use of AI scribe software for clinical note transcription with  the patient, who gave verbal consent to proceed.   Relevant histories: .  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   Studies Reviewed: .     Cardiac Studies & Procedures   ______________________________________________________________________________________________     ECHOCARDIOGRAM  ECHOCARDIOGRAM COMPLETE 05/26/2023  Narrative ECHOCARDIOGRAM REPORT    Patient Name:   Julie Hampton  Date of Exam: 05/26/2023 Medical Rec #:  [id number]     Height:       66.0 in Accession #:    [id number]    Weight:       250.2 lb Date of Birth:  July 18, 1966     BSA:          2.199 m Patient Age:    57 years      BP:           128/84 mmHg Patient Gender: F             HR:           71 bpm. Exam Location:  [address]  Procedure: 2D Echo, 3D Echo, Cardiac Doppler and Color Doppler  Indications:    I50.22 CHF (HFrEF)  History:        Patient has prior history of Echocardiogram examinations, most recent 09/28/2022. CHF, Signs/Symptoms:Dyspnea, Edema and Chest Pain; Risk Factors:Family History of Coronary Artery Disease and Former Smoker. Palpitations, Obesity, Heart Failure with mildly reduced EF (prior 45-50%).  Sonographer:    Heather Hawks RDCS Referring Phys: [phone] JACKEE DEL, JR DICK  IMPRESSIONS   1. Left ventricular ejection fraction, by estimation, is 45 to 50%. The left ventricle has mildly decreased  function. The left ventricle has no regional wall motion abnormalities. Left ventricular diastolic parameters are consistent with Grade I diastolic dysfunction (impaired relaxation). 2. Right ventricular systolic function is normal. The right ventricular size is normal. There is normal pulmonary artery systolic pressure. The estimated right ventricular systolic pressure is 17.0 mmHg. 3. The mitral valve is normal in structure. Trivial mitral valve regurgitation. 4. The aortic valve is tricuspid. Aortic valve regurgitation is not visualized. No aortic stenosis is  present. 5. The inferior vena cava is normal in size with greater than 50% respiratory variability, suggesting right atrial pressure of 3 mmHg.  Comparison(s): No significant change from prior study.  FINDINGS Left Ventricle: Left ventricular ejection fraction, by estimation, is 45 to 50%. The left ventricle has mildly decreased function. The left ventricle has no regional wall motion abnormalities. 3D ejection fraction reviewed and evaluated as part of the interpretation. Alternate measurement of EF is felt to be most reflective of LV function. The left ventricular internal cavity size was normal in size. There is no left ventricular hypertrophy. Left ventricular diastolic parameters are consistent with Grade I diastolic dysfunction (impaired relaxation).  Right Ventricle: The right ventricular size is normal. No increase in right ventricular wall thickness. Right ventricular systolic function is normal. There is normal pulmonary artery systolic pressure. The tricuspid regurgitant velocity is 1.87 m/s, and with an assumed right atrial pressure of 3 mmHg, the estimated right ventricular systolic pressure is 17.0 mmHg.  Left Atrium: Left atrial size was normal in size.  Right Atrium: Right atrial size was normal in size.  Pericardium: There is no evidence of pericardial effusion.  Mitral Valve: The mitral valve is normal in structure. Trivial mitral valve regurgitation.  Tricuspid Valve: The tricuspid valve is normal in structure. Tricuspid valve regurgitation is trivial.  Aortic Valve: The aortic valve is tricuspid. Aortic valve regurgitation is not visualized. No aortic stenosis is present.  Pulmonic Valve: The pulmonic valve was normal in structure. Pulmonic valve regurgitation is trivial.  Aorta: The aortic root is normal in size and structure.  Venous: The inferior vena cava is normal in size with greater than 50% respiratory variability, suggesting right atrial pressure of 3  mmHg.  IAS/Shunts: The atrial septum is grossly normal.   LEFT VENTRICLE PLAX 2D LVIDd:         3.60 cm   Diastology LVIDs:         2.90 cm   LV e' medial:    8.16 cm/s LV PW:         0.80 cm   LV E/e' medial:  11.6 LV IVS:        1.00 cm   LV e' lateral:   10.90 cm/s LVOT diam:     2.20 cm   LV E/e' lateral: 8.7 LV SV:         67 LV SV Index:   30 LVOT Area:     3.80 cm  3D Volume EF: 3D EF:        49 % LV EDV:       173 ml LV ESV:       89 ml LV SV:        84 ml  RIGHT VENTRICLE RV Basal diam:  3.30 cm RV S prime:     11.40 cm/s TAPSE (M-mode): 2.4 cm RVSP:           17.0 mmHg  LEFT ATRIUM  Index        RIGHT ATRIUM           Index LA diam:        4.00 cm 1.82 cm/m   RA Pressure: 3.00 mmHg LA Vol (A2C):   43.2 ml 19.64 ml/m  RA Area:     13.60 cm LA Vol (A4C):   37.0 ml 16.82 ml/m  RA Volume:   36.60 ml  16.64 ml/m LA Biplane Vol: 40.7 ml 18.50 ml/m AORTIC VALVE LVOT Vmax:   73.65 cm/s LVOT Vmean:  50.650 cm/s LVOT VTI:    0.176 m  AORTA Ao Root diam: 3.20 cm Ao Asc diam:  3.60 cm  MITRAL VALVE               TRICUSPID VALVE MV Area (PHT): cm         TR Peak grad:   14.0 mmHg MV Decel Time: 197 msec    TR Vmax:        187.00 cm/s MR Peak grad: 97.6 mmHg    Estimated RAP:  3.00 mmHg MR Mean grad: 69.0 mmHg    RVSP:           17.0 mmHg MR Vmax:      494.00 cm/s MR Vmean:     404.0 cm/s   SHUNTS MV E velocity: 94.55 cm/s  Systemic VTI:  0.18 m MV A velocity: 82.25 cm/s  Systemic Diam: 2.20 cm MV E/A ratio:  1.15  Powell Sorrow MD Electronically signed by Powell Sorrow MD Signature Date/Time: 05/26/2023/1:06:51 PM    Final    MONITORS  LONG TERM MONITOR (3-14 DAYS) 09/29/2022  Narrative   Patient had a minimum heart rate of 50 bpm, maximum heart rate of 169 bpm, and average heart rate of 92 bpm.   Predominant underlying rhythm was sinus rhythm.   Two runs of NSVT longest lasting 10 beats.   Isolated PACs were rare (<1.0%).    Isolated PVCs were rare (<1.0%).   Triggered and diary events associated with sinus rhythm, PVCs, and PACs.  Rarely symptomatic PVCs and PACs.       ______________________________________________________________________________________________      Physical Exam:    VS:  BP 120/87   Pulse 69   Ht 5' 6 (1.676 m)   Wt 236 lb (107 kg)   SpO2 96%   BMI 38.09 kg/m    Wt Readings from Last 3 Encounters:  09/08/24 236 lb (107 kg)  01/25/24 239 lb (108.4 kg)  11/21/23 250 lb (113.4 kg)    Gen: no distress morbid obesity Neck: No JVD Cardiac: No Rubs or Gallops, no Murmur, RRR +2 radial pulses Respiratory: Clear to auscultation bilaterally, normal effort, normal  respiratory rate GI: Soft, nontender, non-distended  MS: No  edema;  moves all extremities Integument: Skin feels warm Neuro:  At time of evaluation, alert and oriented to person/place/time/situation  Psych: Normal affect, patient feels ok   ASSESSMENT AND PLAN: .    Heart failure with mildly reduced ejection fraction - NYHA II, clinically euvolemic, chronic combined with morbid obesity - Chronic heart failure with mildly reduced ejection fraction, presenting with nocturnal dyspnea and intermittent tachycardia. Echocardiogram indicates persistent decrease in ejection fraction. Possible congestive symptoms due to fluid retention. Previous CT pulmonary embolism protocol showed no significant plaque or calcium buildup. Differential includes potential blockage or inflammation. Current medications include metoprolol  and Farxiga . Previous intolerance to spironolactone  and low blood pressure precludes use of Entresto . - Order basic natriuretic  peptide test to assess fluid status. - Consider starting diuretic (Lasix or furosemide) based on natriuretic peptide results (either PRN or standing based on results) - Order cardiac CT to evaluate for blockages. - Monitor kidney function and electrolytes if diuretic is initiated (BMP) -  did not tolerate MRA, ARB or ARNI - continue SGLT2i and BB) - if no improvement in LVEF consider CMR  Supraventricular tachycardia; PACs and PVCs - Supraventricular tachycardia and ventricular premature depolarizations with improved symptoms on current medication regimen. Episodes of tachycardia are less frequent and shorter in duration. - no change in therapy  Three months f/u   Longitudinal care: The evaluation and management services provided today reflect the complexity inherent in caring for this patient, including the ongoing longitudinal relationship and management of multiple chronic conditions and/or the need for care coordination. The visit required a comprehensive assessment and management plan tailored to the patient's unique needs Time was spent addressing not only the acute concerns but also the broader context of the patient's health, including preventive care, chronic disease management, and care coordination as appropriate.  Complex longitudinal is necessary for conditions including: optimization of HF therapies; goal of NYHA status  Stanly Leavens, MD FASE [facility] Cardiologist [facility]  [address] [place], [address] [place], [place] [address] [phone]  10:34 AM

## 2024-09-08 NOTE — Patient Instructions (Signed)
 Medication Instructions:  No changes *If you need a refill on your cardiac medications before your next appointment, please call your pharmacy*  Lab Work: BNP- (based on these results he may start a diuretic) will notify after results If you have labs (blood work) drawn today and your tests are completely normal, you will receive your results only by: MyChart Message (if you have MyChart) OR A paper copy in the mail If you have any lab test that is abnormal or we need to change your treatment, we will call you to review the results.  Testing/Procedures:   Your cardiac CT will be scheduled at:   [facility] [address]  [place], [place] [address] [phone]  If scheduled at the Heart and Vascular Tower at [facility] street, please enter the parking lot using the [address] entrance and use the FREE valet service at the patient drop-off area. Enter the building and check-in with registration on the main floor.    Please follow these instructions carefully (unless otherwise directed):  An IV will be required for this test and Nitroglycerin will be given.    On the Night Before the Test: Be sure to Drink plenty of water. Do not consume any caffeinated/decaffeinated beverages or chocolate 12 hours prior to your test. Do not take any antihistamines 12 hours prior to your test.  If the patient has contrast allergy: Patient will need a prescription for Prednisone and very clear instructions (as follows): Prednisone 50 mg - take 13 hours prior to test Take another Prednisone 50 mg 7 hours prior to test Take another Prednisone 50 mg 1 hour prior to test Take Benadryl  50 mg 1 hour prior to test Patient must complete all four doses of above prophylactic medications. Patient will need a ride after test due to Benadryl .  On the Day of the Test: Drink plenty of water until 1 hour prior to the test. Do not eat any food 1 hour prior to test. You  may take your regular medications prior to the test.  Take metoprolol  (Lopressor ) two hours prior to test. If you take Furosemide/Hydrochlorothiazide/Spironolactone /Chlorthalidone, please HOLD on the morning of the test. Patients who wear a continuous glucose monitor MUST remove the device prior to scanning. FEMALES- please wear underwire-free bra if available, avoid dresses & tight clothing  After the Test: Drink plenty of water. After receiving IV contrast, you may experience a mild flushed feeling. This is normal. On occasion, you may experience a mild rash up to 24 hours after the test. This is not dangerous. If this occurs, you can take Benadryl  25 mg, Zyrtec, Claritin, or Allegra and increase your fluid intake. (Patients taking Tikosyn should avoid Benadryl , and may take Zyrtec, Claritin, or Allegra) If you experience trouble breathing, this can be serious. If it is severe call 911 IMMEDIATELY. If it is mild, please call our office.  We will call to schedule your test 2-4 weeks out understanding that some insurance companies will need an authorization prior to the service being performed.   For more information and frequently asked questions, please visit our website : [email or web address]  For non-scheduling related questions, please contact the cardiac imaging nurse navigator should you have any questions/concerns: Cardiac Imaging Nurse Navigators Direct Office Dial: [phone]   For scheduling needs, including cancellations and rescheduling, please call [place], [phone].   Follow-Up: At [facility], you and your health needs are our priority.  As part of our continuing mission to provide  you with exceptional heart care, our providers are all part of one team.  This team includes your primary Cardiologist (physician) and Advanced Practice Providers or APPs (Physician Assistants and Nurse Practitioners) who all work together to provide you with the care  you need, when you need it.  Your next appointment:   3 month(s)  Provider:   Stanly DELENA Leavens, MD  or any APP  We recommend signing up for the patient portal called MyChart.  Sign up information is provided on this After Visit Summary.  MyChart is used to connect with patients for Virtual Visits (Telemedicine).  Patients are able to view lab/test results, encounter notes, upcoming appointments, etc.  Non-urgent messages can be sent to your provider as well.   To learn more about what you can do with MyChart, go to [email or web address].

## 2024-09-10 LAB — BRAIN NATRIURETIC PEPTIDE: BNP: 8.5 pg/mL (ref 0.0–100.0)

## 2024-09-11 ENCOUNTER — Ambulatory Visit: Payer: Self-pay

## 2024-09-11 DIAGNOSIS — I2510 Atherosclerotic heart disease of native coronary artery without angina pectoris: Secondary | ICD-10-CM

## 2024-09-11 MED ORDER — FUROSEMIDE 20 MG PO TABS: 20.0000 mg | ORAL_TABLET | Freq: Every day | ORAL | 11 refills | Status: AC | PRN

## 2024-09-11 MED ORDER — FUROSEMIDE 20 MG PO TABS: 20.0000 mg | ORAL_TABLET | Freq: Every day | ORAL | 11 refills | Status: DC | PRN

## 2024-09-13 ENCOUNTER — Encounter (HOSPITAL_COMMUNITY): Payer: Self-pay

## 2024-09-15 ENCOUNTER — Ambulatory Visit (HOSPITAL_COMMUNITY)
Admission: RE | Admit: 2024-09-15 | Discharge: 2024-09-15 | Disposition: A | Discharge status: Home / Self Care | Source: Ambulatory Visit | Attending: Internal Medicine | Admitting: Internal Medicine

## 2024-09-15 DIAGNOSIS — I5043 Acute on chronic combined systolic (congestive) and diastolic (congestive) heart failure: Secondary | ICD-10-CM | POA: Diagnosis present

## 2024-09-15 MED ORDER — NITROGLYCERIN 0.4 MG SL SUBL
0.8000 mg | SUBLINGUAL_TABLET | Freq: Once | SUBLINGUAL | Status: AC
  Administered 2024-09-15: 0.8 mg via SUBLINGUAL

## 2024-09-15 MED ORDER — IOHEXOL 350 MG/ML SOLN
100.0000 mL | Freq: Once | INTRAVENOUS | Status: AC | PRN
  Administered 2024-09-15: 100 mL via INTRAVENOUS

## 2024-09-18 ENCOUNTER — Other Ambulatory Visit: Payer: Self-pay | Admitting: Nurse Practitioner

## 2024-09-18 NOTE — Addendum Note (Signed)
 Addended by: RANDY HAMP SAILOR on: 09/18/2024 05:48 PM   Modules accepted: Orders

## 2024-10-25 ENCOUNTER — Ambulatory Visit: Admitting: Pharmacist Clinician (PhC)/ Clinical Pharmacy Specialist

## 2024-10-26 ENCOUNTER — Other Ambulatory Visit: Payer: Self-pay | Admitting: Internal Medicine

## 2024-11-15 ENCOUNTER — Encounter: Payer: Self-pay | Admitting: Internal Medicine

## 2024-11-19 ENCOUNTER — Emergency Department (HOSPITAL_COMMUNITY)

## 2024-11-19 ENCOUNTER — Other Ambulatory Visit: Payer: Self-pay

## 2024-11-19 ENCOUNTER — Observation Stay (HOSPITAL_COMMUNITY): Admission: EM | Admit: 2024-11-19 | Discharge: 2024-11-20 | Disposition: A

## 2024-11-19 ENCOUNTER — Encounter (HOSPITAL_COMMUNITY): Payer: Self-pay | Admitting: *Deleted

## 2024-11-19 DIAGNOSIS — G40909 Epilepsy, unspecified, not intractable, without status epilepticus: Secondary | ICD-10-CM | POA: Diagnosis not present

## 2024-11-19 DIAGNOSIS — R079 Chest pain, unspecified: Secondary | ICD-10-CM | POA: Diagnosis present

## 2024-11-19 DIAGNOSIS — Z6838 Body mass index (BMI) 38.0-38.9, adult: Secondary | ICD-10-CM | POA: Diagnosis not present

## 2024-11-19 DIAGNOSIS — Z79899 Other long term (current) drug therapy: Secondary | ICD-10-CM | POA: Diagnosis not present

## 2024-11-19 DIAGNOSIS — K8070 Calculus of gallbladder and bile duct without cholecystitis without obstruction: Secondary | ICD-10-CM | POA: Diagnosis not present

## 2024-11-19 DIAGNOSIS — I509 Heart failure, unspecified: Secondary | ICD-10-CM | POA: Diagnosis not present

## 2024-11-19 DIAGNOSIS — Z91040 Latex allergy status: Secondary | ICD-10-CM | POA: Diagnosis not present

## 2024-11-19 DIAGNOSIS — Z1152 Encounter for screening for COVID-19: Secondary | ICD-10-CM | POA: Diagnosis not present

## 2024-11-19 DIAGNOSIS — I110 Hypertensive heart disease with heart failure: Secondary | ICD-10-CM | POA: Diagnosis not present

## 2024-11-19 DIAGNOSIS — E785 Hyperlipidemia, unspecified: Secondary | ICD-10-CM | POA: Diagnosis not present

## 2024-11-19 LAB — COMPREHENSIVE METABOLIC PANEL WITH GFR
ALT: 18 U/L (ref 0–44)
AST: 23 U/L (ref 15–41)
Albumin: 4.1 g/dL (ref 3.5–5.0)
Alkaline Phosphatase: 51 U/L (ref 38–126)
Anion gap: 10 (ref 5–15)
BUN: 18 mg/dL (ref 6–20)
CO2: 28 mmol/L (ref 22–32)
Calcium: 9.1 mg/dL (ref 8.9–10.3)
Chloride: 101 mmol/L (ref 98–111)
Creatinine, Ser: 0.86 mg/dL (ref 0.44–1.00)
GFR, Estimated: >60 mL/min (ref >60)
Glucose, Bld: 133 mg/dL — ABNORMAL HIGH (ref 70–99)
Potassium: 4.2 mmol/L (ref 3.5–5.1)
Sodium: 139 mmol/L (ref 135–145)
Total Bilirubin: 0.8 mg/dL (ref 0.0–1.2)
Total Protein: 6.8 g/dL (ref 6.5–8.1)

## 2024-11-19 LAB — CBC WITH DIFFERENTIAL/PLATELET
Abs Immature Granulocytes: 0.03 K/uL (ref 0.00–0.07)
Basophils Absolute: 0.1 K/uL (ref 0.0–0.1)
Basophils Relative: 1 %
Eosinophils Absolute: 0.3 K/uL (ref 0.0–0.5)
Eosinophils Relative: 4 %
HCT: 46 % (ref 36.0–46.0)
Hemoglobin: 16 g/dL — ABNORMAL HIGH (ref 12.0–15.0)
Immature Granulocytes: 0 %
Lymphocytes Relative: 23 %
Lymphs Abs: 1.9 K/uL (ref 0.7–4.0)
MCH: 32.1 pg (ref 26.0–34.0)
MCHC: 34.8 g/dL (ref 30.0–36.0)
MCV: 92.2 fL (ref 80.0–100.0)
Monocytes Absolute: 0.6 K/uL (ref 0.1–1.0)
Monocytes Relative: 8 %
Neutro Abs: 5.4 K/uL (ref 1.7–7.7)
Neutrophils Relative %: 64 %
Platelets: 237 K/uL (ref 150–400)
RBC: 4.99 MIL/uL (ref 3.87–5.11)
RDW: 12.2 % (ref 11.5–15.5)
WBC: 8.4 K/uL (ref 4.0–10.5)
nRBC: 0 % (ref 0.0–0.2)

## 2024-11-19 LAB — RESP PANEL BY RT-PCR (RSV, FLU A&B, COVID)  RVPGX2
Influenza A by PCR: NEGATIVE
Influenza B by PCR: NEGATIVE
Resp Syncytial Virus by PCR: NEGATIVE
SARS Coronavirus 2 by RT PCR: NEGATIVE

## 2024-11-19 LAB — BRAIN NATRIURETIC PEPTIDE: B Natriuretic Peptide: 17.5 pg/mL (ref 0.0–100.0)

## 2024-11-19 LAB — D-DIMER, QUANTITATIVE: D-Dimer, Quant: 0.55 ug{FEU}/mL — ABNORMAL HIGH (ref 0.00–0.50)

## 2024-11-19 LAB — TROPONIN I (HIGH SENSITIVITY)
Troponin I (High Sensitivity): 4 ng/L (ref <18)
Troponin I (High Sensitivity): 3 ng/L (ref <18)

## 2024-11-19 MED ORDER — DAPAGLIFLOZIN PROPANEDIOL 10 MG PO TABS
10.0000 mg | ORAL_TABLET | Freq: Every day | ORAL | Status: DC
  Administered 2024-11-20: 10:00:00 10 mg via ORAL
  Filled 2024-11-19: qty 1

## 2024-11-19 MED ORDER — NITROGLYCERIN 0.4 MG SL SUBL
0.4000 mg | SUBLINGUAL_TABLET | SUBLINGUAL | Status: DC | PRN
  Administered 2024-11-19: 0.4 mg via SUBLINGUAL
  Filled 2024-11-19: qty 1

## 2024-11-19 MED ORDER — ALBUTEROL SULFATE (2.5 MG/3ML) 0.083% IN NEBU: 3.0000 mL | INHALATION_SOLUTION | Freq: Four times a day (QID) | RESPIRATORY_TRACT | Status: DC | PRN

## 2024-11-19 MED ORDER — ISOSORBIDE MONONITRATE ER 30 MG PO TB24
30.0000 mg | ORAL_TABLET | Freq: Every day | ORAL | Status: DC
  Administered 2024-11-19 – 2024-11-20 (×2): 30 mg via ORAL
  Filled 2024-11-19 (×2): qty 1

## 2024-11-19 MED ORDER — METOPROLOL SUCCINATE ER 25 MG PO TB24
25.0000 mg | ORAL_TABLET | Freq: Every day | ORAL | Status: DC
  Administered 2024-11-20: 10:00:00 25 mg via ORAL
  Filled 2024-11-19: qty 1

## 2024-11-19 MED ORDER — LEVETIRACETAM ER 500 MG PO TB24
2500.0000 mg | ORAL_TABLET | Freq: Every day | ORAL | Status: DC
  Administered 2024-11-19: 2500 mg via ORAL
  Filled 2024-11-19 (×2): qty 5

## 2024-11-19 MED ORDER — FUROSEMIDE 20 MG PO TABS: 20.0000 mg | ORAL_TABLET | Freq: Every day | ORAL | Status: DC | PRN

## 2024-11-19 NOTE — H&P (Incomplete)
 Date: 11/19/2024               Patient Name:  Julie Hampton MRN: [id number]  DOB: 15-Mar-1966 Age / Sex: 58 y.o., female   PCP: Stephanie Charlene LITTIE, MD         Medical Service: Internal Medicine Teaching Service         Attending Physician: Dr. Reyes Fenton      First Contact:  Alan Maiden       Second Contact: Dr. Toma Edwards, DO         Pager Information: First Contact Pager: [phone]   Second Contact Pager: [phone]   SUBJECTIVE   Chief Complaint: Chest Pain   History of Present Illness: Julie Hampton is a 58 y.o. female with PMH of seizures on Keppra , .  The patient presents with chest pain that started on Friday evening and has been intermittent since. The pain is left-sided, radiating to the left jaw and left arm, and is described as feeling like a bruise. The patient reports a tingling sensation in the left arm and jaw but denies complete numbness. She also mentions drooling, which is new and concerning to her. The pain comes and goes in waves, and it worsens with physical activity but improves when at rest.  The patient describes the pain as 6-7/10, which eases to a 4/10 with Tylenol  and nitroglycerin , which has been effective in providing relief. There is no associated shortness of breath, and the pain has not been associated with vision changes or significant sweating.  The patients blood pressure was elevated to 160/105 on Friday evening but then gradually decreased. Her symptoms seemed to ease on Saturday but returned this morning, affecting her ability to finish eating lunch. She denies previous pain of this nature and is concerned because the symptoms feel different from anything she has experienced before.  The patient has a history of mild systolic dysfunction and is on GDMT (Guideline-Directed Medical Therapy), including Lopressor  and Farxiga . She had a Coronary CT in October, which showed normal coronary arteries but did reveal cholelithiasis with multiple  partially calcified gallstones, the largest being 1.5 cm.  Presents with *** Endorses *** Denies ***  *** ED Course: Labs significant for *** Imaging *** Received *** Consulted ***  Meds:  Patient reported: ***  Active Medications[1]  Past Medical History ***  Past Surgical History Past Surgical History:  Procedure Laterality Date   APPENDECTOMY  01/2011   COLONOSCOPY     removed pre-cancerous polyps   KNEE SURGERY Right 12/08/2016   KNEE SURGERY  10/2022     Social:  Lives With: Occupation: Support: Level of Function: PCP: *** Hamrick, Charlene LITTIE, MD  Substances: -Tobacco: *** -Alcohol: *** -Recreational Drug: ***  Family History: *** Family History  Problem Relation Age of Onset   Colon cancer Mother    Heart failure Father    Breast cancer Other        Aunt   Epilepsy Sister    Skin cancer Sister      Allergies: Allergies as of 11/19/2024 - Review Complete 11/19/2024  Allergen Reaction Noted   Iodinated contrast media Hives and Shortness Of Breath 08/16/2015   Penicillins Anaphylaxis 11/07/2013   Thallous chloride tl 201 Hives 04/06/2016   Morphine  and codeine Other (See Comments) 10/22/2014   Other  10/22/2014   Alpha-gal Hives and Palpitations 05/07/2013   Bovine (beef) protein Rash 04/06/2016   Latex Dermatitis 01/20/2021   Strawberry extract Rash 09/07/2022  Thallium Hives 10/22/2014    Review of Systems: A complete ROS was negative except as per HPI.   OBJECTIVE:   Physical Exam: Blood pressure 117/65, pulse 74, temperature 97.7 F (36.5 C), temperature source Oral, resp. rate 15, height 5' 6 (1.676 m), weight 107 kg, SpO2 98%.  Constitutional: well-appearing *** sitting in ***, in no acute distress HENT: normocephalic atraumatic, mucous membranes moist Eyes: conjunctiva non-erythematous Neck: supple Cardiovascular: regular rate and rhythm, no m/r/g Pulmonary/Chest: normal work of breathing on room air, lungs  clear to auscultation bilaterally Abdominal: soft, non-tender, non-distended MSK: normal bulk and tone Neurological: alert & oriented x 3, 5/5 strength in bilateral upper and lower extremities, normal gait Skin: warm and dry Psych: ***  Labs: CBC    Component Value Date/Time   WBC 8.4 11/19/2024 1535   RBC 4.99 11/19/2024 1535   HGB 16.0 (H) 11/19/2024 1535   HGB 14.1 04/06/2016 1005   HCT 46.0 11/19/2024 1535   HCT 41.4 04/06/2016 1005   PLT 237 11/19/2024 1535   PLT 232 04/06/2016 1005   MCV 92.2 11/19/2024 1535   MCV 92 04/06/2016 1005   MCH 32.1 11/19/2024 1535   MCHC 34.8 11/19/2024 1535   RDW 12.2 11/19/2024 1535   RDW 12.9 04/06/2016 1005   LYMPHSABS 1.9 11/19/2024 1535   LYMPHSABS 1.8 04/06/2016 1005   MONOABS 0.6 11/19/2024 1535   EOSABS 0.3 11/19/2024 1535   EOSABS 0.2 04/06/2016 1005   BASOSABS 0.1 11/19/2024 1535   BASOSABS 0.0 04/06/2016 1005     CMP     Component Value Date/Time   NA 139 11/19/2024 1535   NA 139 06/11/2023 1006   K 4.2 11/19/2024 1535   CL 101 11/19/2024 1535   CO2 28 11/19/2024 1535   GLUCOSE 133 (H) 11/19/2024 1535   BUN 18 11/19/2024 1535   BUN 11 06/11/2023 1006   CREATININE 0.86 11/19/2024 1535   CALCIUM 9.1 11/19/2024 1535   PROT 6.8 11/19/2024 1535   PROT 6.7 04/06/2016 1005   ALBUMIN 4.1 11/19/2024 1535   ALBUMIN 4.5 04/06/2016 1005   AST 23 11/19/2024 1535   ALT 18 11/19/2024 1535   ALKPHOS 51 11/19/2024 1535   BILITOT 0.8 11/19/2024 1535   BILITOT 0.3 04/06/2016 1005   GFRNONAA >60 11/19/2024 1535   GFRAA >60 09/05/2019 1715    Imaging: *** DG Chest 2 View Result Date: 11/19/2024 CLINICAL DATA:  shob/cp EXAM: CHEST - 2 VIEW COMPARISON:  November 21, 2023 FINDINGS: The cardiomediastinal silhouette is unchanged in contour. No pleural effusion. No pneumothorax. No acute pleuroparenchymal abnormality. Visualized abdomen is unremarkable. Multilevel degenerative changes of the thoracic spine. IMPRESSION: No acute  cardiopulmonary abnormality. Electronically Signed   By: Corean Salter M.D.   On: 11/19/2024 16:23     EKG: personally reviewed my interpretation is***. Prior EKG***  ASSESSMENT & PLAN:   Assessment & Plan by Problem: Principal Problem:   Chest pain, unspecified   Julie Hampton is a 58 y.o. person living with a history of *** who presented with *** and admitted for *** on hospital day 0  *** ***  *** ***  *** ***  Best practice: Diet: {NAMES:3044014::Normal,Heart Healthy,Carb-Modified,Renal,Carb/Renal,NPO,TPN,Tube Feeds} VTE: {NAMES:3044014::Heparin,Enoxaparin,SCDs,DOAC,None} IVF: {NAMES:3044014::None,NS,1/2 NS,LR,D5,D10},{NAMES:3044014::None,10cc/hr,25cc/hr,50cc/hr,75cc/hr,100cc/hr,110cc/hr,125cc/hr,Bolus} Code: {NAMES:3044014::Full,DNR,DNI,DNR/DNI,Comfort Care,Unknown}  Disposition planning: Prior to Admission Living Arrangement: {NAMES:3044014::Home, living ***,SNF, ***,Homeless,***} Anticipated Discharge Location: {NAMES:3044014::Home,SNF,CIR,***}  Dispo: Admit patient to {STATUS:3044014::Observation with expected length of stay less than 2 midnights.,Inpatient with expected length of stay greater than 2 midnights.}  Signed: Renne Homans,  MD Internal Medicine Resident  11/19/2024, 11:55 PM  On Call pager: [phone]       [1] No outpatient medications have been marked as taking for the 11/19/24 encounter [facility] Encounter).

## 2024-11-19 NOTE — ED Notes (Signed)
 Awaiting pt from lobby

## 2024-11-19 NOTE — Hospital Course (Addendum)
 She presented for chest pain Came for left sided side pain radiating to left jaw and left arm Stable vital signs Normal EKG Normal Trops Responded well to Nitro Dr cesario consulted  Keeping her overnight for observation Coronary CT in October looked normal but with  cholelithiasis with multiple partially calcified gallstones, largest 1.5 cm.  On GDMT for her mild systolic dysfunction. -  Lopressor , Farxiga ,   Friday evening, had a lot of chest pain, and blood pressure went up. 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 BP was 160/105 when they checked it, then I started going down. Saturday morning when she got up, had pain in her chest that felt like a bruise and was hurting in her left arm and jaw. Had to focus on taking a good breat. Went on and did things but it did ease up yesterday and pain went away. Got up this morning was hurting again, couldn't finish eating her lunch. Never had pain like this before. Tried to be aware when things are different. Feels like theres a big bruise in her body. Arm also had some diminshed feeling, some tingling but not numb compeletely. Jaw was also a little tingly, but was droolin ga lot, which was new . Kind of comes and goes in waves and hurts through there. Sounds intermittent. Out yesterday shoping and pain came on, but when she was sitting, it eases it up. When out and about and moving it comes back (what about with food). Took some tylenol  for the pain, Between a 6-7, now a 4. No vision changes, pain responded well to {:8051996::nitroprusside,nitroglycerin }. No shortness of breath,   Initial tachycardia two years ago led to ddx of HF because she would get shortness of breath as wlel after getting COVID.   N/V  PMH:  Epilepsy  Multiple knee surgeries   Medications:  Farxiga  10 Keppra  XR 2500mg  at bedtime  Progestone takes at night  Metoprolol  100mg  Lasix  20mg    No alcohol use, doesn't smoke, no drug hx   Independent in all ADLs/IADLs

## 2024-11-19 NOTE — ED Notes (Signed)
 Pt going rxay

## 2024-11-19 NOTE — ED Provider Triage Note (Signed)
 Emergency Medicine Provider Triage Evaluation Note  Julie Hampton , a 58 y.o. female  was evaluated in triage.  Pt complains of chest pain.  Patient reports that she initially had chest pain on Friday and it went away, but today it started again.  She is reporting chest pain on the left side that goes up into her face and down her arm.  She does have a history of heart failure, but no diabetes or any other cardiac problems.  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  She states it is difficult to take a deep breath, thus causing shortness of breath.  She is not on any oxygen.  Review of Systems  Positive: Chest pain, shortness of breath Negative:   Physical Exam  BP (!) 160/96 (BP Location: Right Wrist)   Pulse 88   Temp 97.7 F (36.5 C) (Oral)   Resp 19   Ht 5' 6 (1.676 m)   Wt 107 kg   SpO2 98%   BMI 38.07 kg/m  Gen:   Awake, no distress   Resp:  Normal effort, lung sounds are clear bilaterally MSK:   Moves extremities without difficulty.  Sensation is intact in upper and lower extremities.  Patient's grip strength is 5 out of 5 and equal bilaterally.  No neurologic deficits noted. Other:    Medical Decision Making  Medically screening exam initiated at 3:33 PM.  Appropriate orders placed.  KAZIYAH PARKISON was informed that the remainder of the evaluation will be completed by another provider, this initial triage assessment does not replace that evaluation, and the importance of remaining in the ED until their evaluation is complete.  EKG shows sinus rhythm.  Basic labs and chest x-ray ordered.   Torrence Marry RAMAN, PA-C 11/19/24 1534

## 2024-11-19 NOTE — ED Notes (Signed)
 CCMD called at this time.

## 2024-11-19 NOTE — ED Provider Notes (Signed)
  EMERGENCY DEPARTMENT AT [facility] Provider Note   CSN: [id number] Arrival date & time: 11/19/24  1523     Patient presents with: No chief complaint on file.   Julie Hampton is a 58 y.o. female presenting to the ED with complaint of left-sided chest pain.  Patient reports it began about 2 days ago, worsened significantly yesterday, she was feeling a bruise on the left side of her chest going into her left jaw and down her left arm.  She has some tingling or numbness sensation in her left face and left arm.  She says those symptoms have improved but she still has pressure on her chest.  She has never had the symptoms before.  She does have a history of congestive heart failure for which she follows with cardiology, reports her last echocardiogram had an EF of 45%.  She had a coronary CT scan performed 2 months ago, which was able to review on external records, which noted she had incidental gallstones, and also noted to have nonobstructive coronary artery disease and no evidence of ischemic cardiomyopathy.  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  Denies history of DVT or PE that she is aware of.  She does have hives allergy to iodine  {Add pertinent medical, surgical, social history, OB history to HPI:32947} HPI     Prior to Admission medications  Medication Sig Start Date End Date Taking? Authorizing Provider  albuterol  (VENTOLIN  HFA) 108 (90 Base) MCG/ACT inhaler Inhale 2 puffs into the lungs as needed for wheezing or shortness of breath. 04/05/22   [provider]  dapagliflozin  propanediol (FARXIGA ) 10 MG TABS tablet TAKE 1 TABLET BY MOUTH EVERY DAY BEFORE BREAKFAST 10/30/24   Chandrasekhar, Mahesh A, MD  diphenhydrAMINE  (BENADRYL ) 50 MG tablet Take with your last prednisone  tablet  before leaving for test. 09/08/24   Santo Stanly LABOR, MD   EPINEPHrine (EPIPEN 2-PAK) 0.3 mg/0.3 mL IJ SOAJ injection Inject 0.3 mg into the muscle as needed for anaphylaxis. 05/16/13   [provider]  furosemide  (LASIX ) 20 MG tablet Take 1 tablet (20 mg total) by mouth daily as needed (leg swelling). 09/11/24   Santo Stanly LABOR, MD  levETIRAcetam  (KEPPRA  XR) 500 MG 24 hr tablet Take 5 tablets (2,500 mg total) by mouth at bedtime. 01/25/24   Lomax, Amy, NP  metoprolol  succinate (TOPROL -XL) 25 MG 24 hr tablet TAKE 1 TABLET (25 MG TOTAL) BY MOUTH DAILY. KEEP OV. 09/19/24   Parthenia Olivia HERO, PA-C  metoprolol  tartrate (LOPRESSOR ) 100 MG tablet Take 1 tablet (100 mg total) by mouth once for 1 dose. PLEASE TAKE METOPROLOL  2  HOURS PRIOR TO CTA SCAN. 09/08/24 09/08/24  Santo Stanly LABOR, MD  predniSONE  (DELTASONE ) 50 MG tablet Take 1 Tablet every 6 hours starting the night before the CT. Take last dose before you leave for the test. 09/08/24   Santo Stanly LABOR, MD    Allergies: Iodinated contrast media, Penicillins, Thallous chloride tl 201, Morphine  and codeine, Other, Alpha-gal, Bovine (beef) protein, Latex, Strawberry extract, and Thallium    Review of Systems  Updated Vital Signs BP (!) 160/96 (BP Location: Right Wrist)   Pulse 88   Temp 97.7 F (36.5 C) (Oral)   Resp 19   Ht 5' 6 (1.676 m)   Wt 107 kg   SpO2 98%   BMI 38.07 kg/m   Physical  Exam Constitutional:      General: She is not in acute distress.    Appearance: She is obese.  HENT:     Head: Normocephalic and atraumatic.  Eyes:     Conjunctiva/sclera: Conjunctivae normal.     Pupils: Pupils are equal, round, and reactive to light.  Cardiovascular:     Rate and Rhythm: Normal rate and regular rhythm.  Pulmonary:     Effort: Pulmonary effort is normal. No respiratory distress.  Abdominal:     General: There is no distension.     Tenderness: There is no abdominal tenderness.  Musculoskeletal:     Comments: Brace on right lower leg  Skin:    General:  Skin is warm and dry.  Neurological:     General: No focal deficit present.     Mental Status: She is alert and oriented to person, place, and time. Mental status is at baseline.  Psychiatric:        Mood and Affect: Mood normal.        Behavior: Behavior normal.     (all labs ordered are listed, but only abnormal results are displayed) Labs Reviewed  CBC WITH DIFFERENTIAL/PLATELET - Abnormal; Notable for the following components:      Result Value   Hemoglobin 16.0 (*)    All other components within normal limits  COMPREHENSIVE METABOLIC PANEL WITH GFR - Abnormal; Notable for the following components:   Glucose, Bld 133 (*)    All other components within normal limits  RESP PANEL BY RT-PCR (RSV, FLU A&B, COVID)  RVPGX2  BRAIN NATRIURETIC PEPTIDE  D-DIMER, QUANTITATIVE  TROPONIN I (HIGH SENSITIVITY)  TROPONIN I (HIGH SENSITIVITY)    EKG: EKG Interpretation Date/Time:  Sunday November 19 2024 15:32:54 EST Ventricular Rate:  91 PR Interval:  150 QRS Duration:  90 QT Interval:  386 QTC Calculation: 474 R Axis:   51  Text Interpretation: Normal sinus rhythm When compared with ECG of 08-Sep-2024 08:41, PREVIOUS ECG IS PRESENT No significant changes Confirmed by Cottie Cough [phone]) on 11/19/2024 6:29:06 PM  Radiology: [facility] Chest 2 View Result Date: 11/19/2024 CLINICAL DATA:  shob/cp EXAM: CHEST - 2 VIEW COMPARISON:  November 21, 2023 FINDINGS: The cardiomediastinal silhouette is unchanged in contour. No pleural effusion. No pneumothorax. No acute pleuroparenchymal abnormality. Visualized abdomen is unremarkable. Multilevel degenerative changes of the thoracic spine. IMPRESSION: No acute cardiopulmonary abnormality. Electronically Signed   By: Corean Salter M.D.   On: 11/19/2024 16:23    {Document cardiac monitor, telemetry assessment procedure when appropriate:32947} Procedures   Medications Ordered in the ED  nitroGLYCERIN  (NITROSTAT ) SL tablet 0.4 mg (has no  administration in time range)      {Click here for ABCD2, HEART and other calculators REFRESH Note before signing:1}                              Medical Decision Making Amount and/or Complexity of Data Reviewed Labs: ordered.  Risk Prescription drug management.   This patient presents to the Emergency Department with complaint of chest pain. This involves an extensive number of treatment options, and is a complaint that carries with it a high risk of complications and morbidity, given the patient's comorbidity, including HTN, HLD .The differential diagnosis includes ACS vs Pneumothorax vs Reflux/Gastritis vs MSK pain vs Pneumonia vs other.  I felt PE was less likely given that *** I felt aortic dissection was less likely given that ***  I ordered, reviewed, and interpreted labs.  Pertinent results include *** I ordered medication nitroglycerin  for chest pain I ordered imaging studies which included ***  I independently visualized and interpreted imaging which showed *** and the monitor tracing which showed *** . I agree with the radiologist interpretation Additional history was obtained from patient's husband at bedside External records obtained and reviewed showing coronary CT imaging from 2 months ago with non obstructive CAD noted I personally reviewed the patients ECG which showed sinus rhythm with no acute ischemic findings***  After the interventions stated above, I reevaluated the patient and found that they were ***  Based on the patient's clinical exam, vital signs, risk factors, and ED testing, I felt that the patient's overall risk of life-threatening emergency such as ACS, PE, sepsis, or infection was low.  At this time, I felt the patient's presentation was most clinically consistent with ***, but explained to the patient that this evaluation was not a definitive diagnostic workup.  I discussed outpatient follow up with primary care provider, and provided specialist  office number on the patient's discharge paper if a referral was deemed necessary.  Return precautions were discussed with the patient.  I felt the patient was clinically stable for discharge.   {Document critical care time when appropriate  Document review of labs and clinical decision tools ie CHADS2VASC2, etc  Document your independent review of radiology images and any outside records  Document your discussion with family members, caretakers and with consultants  Document social determinants of health affecting pt's care  Document your decision making why or why not admission, treatments were needed:32947:::1}   Final diagnoses:  None    ED Discharge Orders     None

## 2024-11-19 NOTE — ED Triage Notes (Signed)
 Patient reports chest pain started this morning. Pain is in left chest and radiates up into her left neck. She saod she is also having some shob and having a hard time catching her breathe. She has CHF, denies swelling in legs.

## 2024-11-19 NOTE — Consult Note (Signed)
 Brief cardiology consult note  67F with EF45-50% presented with chest pain radiating to jaw/L arm. Saw Dr. Santo in October with similar symptoms.  Coronary CTA done at that time, while there is no formal read on the coronaries in Epic, in reviewing the images, there is no evidence of flow obstruction in major coronary vessels.  There was a note of gallstones.  Her chest pain seems to be responsive somewhat to nitroglycerin  in the ED.  ECG without overt ST changes suggestive of ischemia.  Two troponin levels <10.  CXR unremarkable.  Do not suspect ACS. She is already started on GDMT for her mild systolic dysfunction. Given the response to nitroglycerin , it may be worth trying Imdur  60 daily to see if that helps with chest discomfort symptoms.  It may also be worth working her up for cholelithiasis  Andee Flatten MD Cards on call

## 2024-11-19 NOTE — H&P (Addendum)
 Date: 11/20/2024               Patient Name:  Julie Hampton MRN: [id number]  DOB: 05/10/1966 Age / Sex: 58 y.o., female   PCP: Stephanie Charlene LITTIE, MD         Medical Service: Internal Medicine Teaching Service         Attending Physician: Dr. Reyes Fenton      First Contact:  Alan Maiden       Second Contact: Dr. Toma Edwards, DO         Pager Information: First Contact Pager: [phone]   Second Contact Pager: [phone]   SUBJECTIVE   Chief Complaint: Chest Pain   History of Present Illness: Julie Hampton is a 58 y.o. female with PMH of seizures on Keppra ,CHF,SVT, HTN who presents with chest pain that started on Friday evening and has been intermittent since. The pain is left-sided, radiating to the left jaw and left arm, and is described as feeling like a bruise. The patient reports a tingling sensation in the left arm and jaw but denies complete numbness. She also mentions drooling, which is new and concerning to her. The pain comes and goes in waves, and it worsens with physical activity but improves when at rest.  The patient describes the pain as 6-7/10, which eases to a 4/10 with Tylenol  and nitroglycerin , which has been effective in providing relief. There is no associated shortness of breath, and the pain has not been associated with vision changes or significant sweating.  The patients blood pressure was elevated to 160/105 on Friday evening but then gradually decreased. Her symptoms seemed to ease on Saturday but returned this morning, affecting her ability to finish eating lunch. She denies previous pain of this nature and is concerned because the symptoms feel different from anything she has experienced before.She denies any acid reflux symptoms   The patient has a history of mild systolic dysfunction ( Last ECHO 45-50% - 05/2023) and is on Lopressor  and Farxiga . She had a Coronary CT in October,2025, which showed normal coronary arteries but did reveal cholelithiasis  with multiple partially calcified gallstones, the largest being 1.5 cm.   ED Course: Labs significant for: Trops of 3 <- 4, BNP 17.5 Imaging : CXR: No acute cardiopulmonary abnormality.  Received : Nitroglycerin   Consulted :Cardiology   Meds:  Patient reported:  Farxiga  10 Keppra  XR 2500mg  at bedtime  Progestone takes at night  Metoprolol  100mg  Lasix  20mg    Past Medical History CHF Hypertension Obesity Seizures  Past Surgical History Knee surgery in 2023 Appendectomy in 2012  Social:  Lives With: Home with her husband  Occupation: Freight Forwarder  Support: Self and husband  Level of Function: Independent on all ADLs/iADLs PCP:  Hamrick, Charlene LITTIE, MD  Substances: -Tobacco: Denies current or previous use  -Alcohol: Denies current or previous use  -Recreational Drug: Denies current or previous use   Family History: Epilepsy - Sister  Family History  Problem Relation Age of Onset   Colon cancer Mother    Heart failure Father    Breast cancer Other        Aunt   Epilepsy Sister    Skin cancer Sister      Allergies: Allergies as of 11/19/2024 - Review Complete 11/19/2024  Allergen Reaction Noted   Iodinated contrast media Hives and Shortness Of Breath 08/16/2015   Penicillins Anaphylaxis 11/07/2013   Thallous chloride tl 201 Hives 04/06/2016   Morphine  and codeine Other (  See Comments) 10/22/2014   Other  10/22/2014   Alpha-gal Hives and Palpitations 05/07/2013   Bovine (beef) protein Rash 04/06/2016   Latex Dermatitis 01/20/2021   Strawberry extract Rash 09/07/2022   Thallium Hives 10/22/2014    Review of Systems: A complete ROS was negative except as per HPI.   OBJECTIVE:   Physical Exam: Blood pressure 117/65, pulse 74, temperature 97.7 F (36.5 C), temperature source Oral, resp. rate 15, height 5' 6 (1.676 m), weight 107 kg, SpO2 98%.  Constitutional: well-appearing woman, Laying in bed , in no acute distress HENT: normocephalic  atraumatic, mucous membranes moist Cardiovascular: regular rate and rhythm, no m/r/g. Mild tenderness to palpation of left chest  Pulmonary/Chest: normal work of breathing on room air, lungs clear to auscultation bilaterally Abdominal: soft, non-tender, non-distended MSK: normal bulk and tone Neurological: alert & oriented x 3 Skin: warm and dry Psych: Normal mood   Labs: CBC - WBC of 8.4, Hgb of 16.0 H, otherwise unremarkable  CMP - Unremarkable   Imaging: DG Chest 2 View Result Date: 11/19/2024 IMPRESSION: No acute cardiopulmonary abnormality   EKG: personally reviewed my interpretation is normal sinus . Prior EKG 09/08/2024,No changes   ASSESSMENT & PLAN:   Assessment & Plan by Problem: Principal Problem:   Chest pain, unspecified Active Problems:   Seizure disorder [facility])   Julie Hampton is a 58 year old female with a history of CHF, seizures, and hypertension presents with 3 days of left-sided chest pain, worsening with activity and radiating to the jaw, reproducible on palpation and  admitted for further cardiac evaluation.  #Chest Pain  The patient presented with left-sided chest pain that began on Friday. She describes the pain as heaviness, initially 7/10 in severity, worsening with activity and resolving with rest. The pain radiates to the jaw and is reproducible on palpation, which raises suspicion for a musculoskeletal or costochondritis etiology, but it does not definitively rule out cardiac causes. The patient reports no recent surgeries, long travel, or calf claudication, making pulmonary embolism less likely despite a mildly elevated D-dimer  at 0.55.WELLS SCORE of 0 .Her exam is unremarkable for a friction rub or S3 gallop, and there is no evidence of lower extremity swelling or shortness of breath. BNP is 17.5, the chest X-ray is negative, and the EKG shows no ischemic changes.Cardiology is consulted and they recommended observing patient overnight for possible further work  up and trial on Imdur  which is already ordered - Admit for cardiac monitoring observation - Continue nitroglycerin  if needed for symptomatic relief - Cardiology to further assess in the morning  #CHF #Hypertension #SVT  The patient's most recent echocardiogram (June 2024) shows an ejection fraction (EF) of 45-50%, which is consistent with mild systolic dysfunction. At this time, the patient is not in an acute exacerbation of heart failure. Blood pressure is stable at 133/77. EKG normal sinus. The patient is currently managed with Farxiga , Lasix  (PRN), and Metoprolol  succinate. The next follow-up appointment is scheduled with Dr. Stanly A. Chandrasekhar on December 11, 2024. During this hospitalization, the patient will be monitored closely for any changes in cardiac status, and her medications will be resumed in the absence of any contraindications. - Resume Farxiga  10 mg daily - Resume Lasix  20 mg daily as needed - Resume metoprolol  succinate 25 mg oral daily - Start Imdur  30 mg daily  #History of seizures - Resume home Keppra  2500 mg daily at bedtime  #Cholelithiasis # Hyperlipidemia Cholelithiasis with multiple partially calcified gallstones, the largest measuring 1.5  cm, was incidentally discovered during a coronary CT on 09/15/2024. The patient is currently asymptomatic, with no right upper quadrant pain or tenderness. Her CMP results are reassuring.A lipid panel done on 07/20/2024 showed an LDL of 185. Given her history of morbid obesity and the bluish discoloration of her toe, I believe she would benefit from statin therapy. We discussed this at the bedside, and the patient mentioned that her cardiologist had also noted the possibility of starting statin therapy during her follow-up appointment in January 2026. 10 year ASCVD risk is 3.2% - Continue to monitor for any symptoms or complications related to gallstones, such as right upper quadrant pain, nausea, or jaundice. - No immediate  intervention required at this time since the patient is asymptomatic. - Encourage dietary modifications and weight loss to help reduce LDL levels and improve overall cardiovascular health.   Best practice: Diet: NPO VTE: SCDs IVF: None,None Code: Full  Disposition planning: Prior to Admission Living Arrangement: Home, living with husband  Anticipated Discharge Location: Home  Dispo: Admit patient to Observation with expected length of stay less than 2 midnights.  Signed: Renne Homans, MD Internal Medicine Resident  11/20/2024, 5:19 AM  On Call pager: [phone]

## 2024-11-20 ENCOUNTER — Other Ambulatory Visit (HOSPITAL_COMMUNITY): Payer: Self-pay

## 2024-11-20 DIAGNOSIS — E669 Obesity, unspecified: Secondary | ICD-10-CM | POA: Diagnosis not present

## 2024-11-20 DIAGNOSIS — I5032 Chronic diastolic (congestive) heart failure: Secondary | ICD-10-CM

## 2024-11-20 DIAGNOSIS — Z8679 Personal history of other diseases of the circulatory system: Secondary | ICD-10-CM

## 2024-11-20 DIAGNOSIS — R0789 Other chest pain: Secondary | ICD-10-CM | POA: Diagnosis not present

## 2024-11-20 DIAGNOSIS — Z8669 Personal history of other diseases of the nervous system and sense organs: Secondary | ICD-10-CM

## 2024-11-20 DIAGNOSIS — I110 Hypertensive heart disease with heart failure: Secondary | ICD-10-CM

## 2024-11-20 DIAGNOSIS — K8020 Calculus of gallbladder without cholecystitis without obstruction: Secondary | ICD-10-CM | POA: Diagnosis not present

## 2024-11-20 DIAGNOSIS — E785 Hyperlipidemia, unspecified: Secondary | ICD-10-CM

## 2024-11-20 DIAGNOSIS — I509 Heart failure, unspecified: Secondary | ICD-10-CM

## 2024-11-20 DIAGNOSIS — R0782 Intercostal pain: Secondary | ICD-10-CM

## 2024-11-20 LAB — BASIC METABOLIC PANEL WITH GFR
Anion gap: 10 (ref 5–15)
BUN: 14 mg/dL (ref 6–20)
CO2: 25 mmol/L (ref 22–32)
Calcium: 9.3 mg/dL (ref 8.9–10.3)
Chloride: 106 mmol/L (ref 98–111)
Creatinine, Ser: 0.79 mg/dL (ref 0.44–1.00)
GFR, Estimated: >60 mL/min (ref >60)
Glucose, Bld: 122 mg/dL — ABNORMAL HIGH (ref 70–99)
Potassium: 3.8 mmol/L (ref 3.5–5.1)
Sodium: 141 mmol/L (ref 135–145)

## 2024-11-20 LAB — HIV ANTIBODY (ROUTINE TESTING W REFLEX): HIV Screen 4th Generation wRfx: NONREACTIVE

## 2024-11-20 MED ORDER — ACETAMINOPHEN 325 MG PO TABS
650.0000 mg | ORAL_TABLET | Freq: Four times a day (QID) | ORAL | Status: DC | PRN
  Administered 2024-11-20: 05:00:00 650 mg via ORAL
  Filled 2024-11-20: qty 2

## 2024-11-20 MED ORDER — LIDOCAINE 5 % EX PTCH
1.0000 | MEDICATED_PATCH | Freq: Every day | CUTANEOUS | Status: DC
  Administered 2024-11-20: 05:00:00 1 via TRANSDERMAL
  Filled 2024-11-20: qty 1

## 2024-11-20 MED ORDER — ISOSORBIDE MONONITRATE ER 30 MG PO TB24
30.0000 mg | ORAL_TABLET | Freq: Every day | ORAL | 0 refills | Status: AC
  Filled 2024-11-20: qty 30, 30d supply, fill #0

## 2024-11-20 MED ORDER — LIDOCAINE 5 % EX PTCH
1.0000 | MEDICATED_PATCH | Freq: Every day | CUTANEOUS | 0 refills | Status: AC
  Filled 2024-11-20: qty 30, 30d supply, fill #0

## 2024-11-20 NOTE — ED Notes (Signed)
 Attempted to contact Admitting provider at [phone] for tylenol  order since pt is experiencing a headache from previous nitroglycerin  administration. Admitting provider was contacted at [phone] by previous nurse as well with no answer.

## 2024-11-20 NOTE — Discharge Instructions (Addendum)
 Dear Sari LITTIE Parkin,  Thank you for letting us  participate in your care. You were hospitalized for chest pain. We performed a thorough evaluation to look for serious causes such as a heart attack, blood clot, or lung problem. All testing was normal, including heart tracings, blood tests, chest X-ray, and monitoring.  Your symptoms improved with conservative treatment, and no further inpatient testing was needed.  Other conditions managed during this admission included congestive heart failure, high blood pressure, a history of supraventricular tachycardia, seizure disorder, gallstones, and high cholesterol, all of which remained stable.  MEDICATION CHANGES - No medication changes were made during this admission - Continue all home medications as prescribed  POST-HOSPITAL & CARE INSTRUCTIONS Seek immediate medical attention for new or worsening chest pain, shortness of breath, dizziness, fainting, palpitations, or pain not reproducible with touch. Please call to arrange follow up with your primary care physician in 1 week.  Go to your follow up appointments (listed below)   DOCTOR'S APPOINTMENT   Future Appointments  Date Time Provider Department Center  12/11/2024  9:00 AM Alvstad, Kristin L, RPH-CPP [facility]    Take care and be well!  Internal Medicine Teaching Service Inpatient Team [place]  [facility]  [address] [place], [place] [address] [phone]
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
## 2024-11-20 NOTE — ED Notes (Signed)
 Floor coverage paged to address pts headache.

## 2024-11-20 NOTE — Consult Note (Addendum)
 Cardiology Consultation   Patient ID: Julie Hampton MRN: [id number]; DOB: 16-Dec-1965  Admit date: 11/19/2024 Date of Consult: 11/20/2024  PCP:  Stephanie Charlene LITTIE, MD   [facility] Providers Cardiologist:  Stanly DELENA Leavens, MD        Patient Profile:   Julie Hampton is a 58 y.o. female with a hx of palpitations, SVT, PVCs, event monitor 12/30/2021 revealing 2 runs of NSVT longest 10 beats and symptomatic PACs and PVCs, heart failure with mildly reduced LVEF by echocardiogram 05/26/2023, coronary who is being seen 11/20/2024 for the evaluation of chest pain at the request of Drue Grow.  History of Present Illness:   Julie Hampton is a 58 y.o. with history of SVT, palpitations, chronic chest pain, chronic diastolic heart failure with low normal to mildly reduced LVEF, morbid obesity presented with chest pain ongoing for the past few days.  Chest pain symptoms are described as tightness to heaviness in the left upper part of the chest with radiation to the left arm, each episode lasted anywhere from 20 minutes to 45 minutes with no other associated symptoms of nausea, vomiting or diaphoresis.  She has been under extreme stress, states that she has not slept well recently.  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  Denies symptoms suggestive of sleep apnea, she snores occasionally.  No long travel, no leg edema, no painful swelling of the lower extremity or hemoptysis.  Denies dyspnea, PND or orthopnea.  Past Medical History:  Diagnosis Date   Chest pain    CHF (congestive heart failure) (HCC)    Hypertension    Leg swelling    Near syncope    Obesity    Palpitations    Seizures (HCC)     Past Surgical History:  Procedure Laterality Date   APPENDECTOMY  01/2011   COLONOSCOPY     removed pre-cancerous polyps   KNEE SURGERY Right 12/08/2016   KNEE SURGERY  10/2022     Home Medications:  Prior to Admission medications  Medication Sig  Start Date End Date Taking? Authorizing Provider  acetaminophen  (TYLENOL ) 500 MG tablet Take 500 mg by mouth every 6 (six) hours as needed for mild pain (pain score 1-3) or moderate pain (pain score 4-6).   Yes [provider]  albuterol  (VENTOLIN  HFA) 108 (90 Base) MCG/ACT inhaler Inhale 2 puffs into the lungs every 4 (four) hours as needed for wheezing or shortness of breath.   Yes [provider]  clotrimazole-betamethasone (LOTRISONE) cream Apply 1 Application topically 2 (two) times daily. 10/10/24  Yes [provider]  dapagliflozin  propanediol (FARXIGA ) 10 MG TABS tablet TAKE 1 TABLET BY MOUTH EVERY DAY BEFORE BREAKFAST 10/30/24  Yes Chandrasekhar, Mahesh A, MD  EPINEPHrine (EPIPEN 2-PAK) 0.3 mg/0.3 mL IJ SOAJ injection Inject 0.3 mg into the muscle as needed for anaphylaxis. 05/16/13  Yes [provider]  furosemide  (LASIX ) 20 MG tablet Take 1 tablet (20 mg total) by mouth daily as needed (leg swelling). Patient taking differently: Take 20 mg by mouth daily. 09/11/24  Yes Chandrasekhar, Mahesh A, MD  levalbuterol (XOPENEX) 1.25 MG/3ML nebulizer solution Take 1.25 mg by nebulization every 6 (six) hours as needed for wheezing.   Yes [provider]  levETIRAcetam  (KEPPRA  XR) 500 MG 24 hr tablet Take 5 tablets (2,500 mg total) by mouth at bedtime. 01/25/24  Yes Lomax, Amy, NP  metoprolol  succinate (TOPROL -XL) 25 MG 24 hr tablet TAKE 1 TABLET (  25 MG TOTAL) BY MOUTH DAILY. KEEP OV. 09/19/24  Yes Parthenia Olivia HERO, PA-C  progesterone (PROMETRIUM) 200 MG capsule Take 200 mg by mouth at bedtime.   Yes [provider]    Inpatient Medications: Scheduled Meds:  dapagliflozin  propanediol  10 mg Oral Daily   isosorbide  mononitrate  30 mg Oral Daily   levETIRAcetam   2,500 mg Oral QHS   lidocaine   1 patch Transdermal Daily   metoprolol  succinate  25 mg Oral Daily   PRN Meds: acetaminophen , albuterol , furosemide , nitroGLYCERIN   Allergies:    Allergies[1]  Social History:   Social History   Tobacco Use   Smoking status: Former   Smokeless tobacco: Never   Tobacco comments:    Quit- 1995  Substance Use Topics   Alcohol use: No    Comment: Quit in 43   Married    Family History:    Family History  Problem Relation Age of Onset   Colon cancer Mother    Heart failure Father    Breast cancer Other        Aunt   Epilepsy Sister    Skin cancer Sister      ROS:  Review of Systems  Cardiovascular:  Positive for chest pain. Negative for dyspnea on exertion and leg swelling.    Physical Exam    Vitals:   11/20/24 0430 11/20/24 0600 11/20/24 0615 11/20/24 0731  BP: 111/69 125/75 115/70   Pulse: 72 76 80   Resp: 12 15 16    Temp:    97.9 F (36.6 C)  TempSrc:    Oral  SpO2: 97% 100% 100%   Weight:      Height:       Physical Exam Constitutional:      Appearance: She is obese.  Neck:     Vascular: No carotid bruit or JVD.  Cardiovascular:     Rate and Rhythm: Normal rate and regular rhythm.     Pulses: Intact distal pulses.     Heart sounds: Normal heart sounds. No murmur heard.    No gallop.  Pulmonary:     Effort: Pulmonary effort is normal.     Breath sounds: Normal breath sounds.  Abdominal:     General: Abdomen is protuberant. Bowel sounds are normal.     Palpations: Abdomen is soft.  Musculoskeletal:     Right lower leg: No edema.     Left lower leg: No edema.        11/19/2024    3:31 PM 09/08/2024    8:37 AM 01/25/2024    8:44 AM  Last 3 Weights  Weight (lbs) 235 lb 14.3 oz 236 lb 239 lb  Weight (kg) 107 kg 107.049 kg 108.41 kg      Labs   Lab Results  Component Value Date   NA 141 11/20/2024   K 3.8 11/20/2024   CO2 25 11/20/2024   GLUCOSE 122 (H) 11/20/2024   BUN 14 11/20/2024   CREATININE 0.79 11/20/2024   CALCIUM 9.3 11/20/2024   EGFR 88 06/11/2023   GFRNONAA >60 11/20/2024       Latest Ref Rng & Units 11/20/2024    5:35 AM 11/19/2024    3:35 PM 11/21/2023     2:24 PM  BMP  Glucose 70 - 99 mg/dL 877  866  99   BUN 6 - 20 mg/dL 14  18  11    Creatinine 0.44 - 1.00 mg/dL 9.20  9.13  9.13   Sodium 135 -  145 mmol/L 141  139  136   Potassium 3.5 - 5.1 mmol/L 3.8  4.2  4.4   Chloride 98 - 111 mmol/L 106  101  105   CO2 22 - 32 mmol/L 25  28  20    Calcium 8.9 - 10.3 mg/dL 9.3  9.1  9.5        Latest Ref Rng & Units 11/19/2024    3:35 PM 11/21/2023    2:24 PM 08/10/2022    1:38 PM  CBC  WBC 4.0 - 10.5 K/uL 8.4  7.6  6.6   Hemoglobin 12.0 - 15.0 g/dL 83.9  84.3  85.3   Hematocrit 36.0 - 46.0 % 46.0  46.0  43.2   Platelets 150 - 400 K/uL 237  242  244     Lab Results  Component Value Date   TRIG 151 (H) 09/05/2019    No results found for: TSH  No results found for: HGBA1C  High Sensitivity Troponin:   Recent Labs  Lab 11/19/24 1535 11/19/24 1845  TROPONINIHS 4 3      Cardiac Panel (last 3 results) Recent Labs    11/19/24 1535 11/19/24 1845  TROPONINIHS 4 3    BNP (last 3 results) Recent Labs    11/21/23 1609 09/08/24 0950 11/19/24 1535  BNP 40.7 8.5 17.5  DDimer  Recent Labs  Lab 11/19/24 1845  DDIMER 0.55*    Tele/EKG/Cardiac studies    EKG:  EKG 11/19/2024: Normal sinus rhythm at rate of 91 bpm, normal EKG.  Telemetry 11/20/2024: Normal sinus rhythm.  CT CORONARY MORPH W/CTA COR W/SCORE 09/15/2024  1. Minimal, non-obstructive, predominantly calcified coronary artery disease involving the LAD and LCx arteries resulting in <25% stenosis. CADRADS = 1. CT FFR will not be performed.   2. Coronary calcium score of 11.6. This was 81st percentile for age-, sex-, and race- matched controls.  3. Total plaque volume 12.0  mm3.  4. Normal coronary origin with right dominance.  ECHOCARDIOGRAM COMPLETE 05/26/2023  1. Left ventricular ejection fraction, by estimation, is 45 to 50%. The left ventricle has mildly decreased function. The left ventricle has no regional wall motion abnormalities. Left ventricular diastolic  parameters are consistent with Grade I diastolic dysfunction (impaired relaxation). 2. Right ventricular systolic function is normal. The right ventricular size is normal. There is normal pulmonary artery systolic pressure. The estimated right ventricular systolic pressure is 17.0 mmHg. 3. The mitral valve is normal in structure. Trivial mitral valve regurgitation. 4.  No significant change from 09/28/2022  Radiology  DG Chest 2 View Result Date: 11/19/2024 CLINICAL DATA:  shob/cp EXAM: CHEST - 2 VIEW COMPARISON:  November 21, 2023 FINDINGS: The cardiomediastinal silhouette is unchanged in contour. No pleural effusion. No pneumothorax. No acute pleuroparenchymal abnormality. Visualized abdomen is unremarkable. Multilevel degenerative changes of the thoracic spine. IMPRESSION: No acute cardiopulmonary abnormality. Electronically Signed   By: Corean Salter M.D.   On: 11/19/2024 16:23    Assessment & Plan .     1.  Noncardiac chest pain, normal EKG, cardiac markers are negative for myocardial injury, chest pain ongoing for the past 3 to 4 days and differentials include GERD, musculoskeletal pain, unlikely to be Prinzmetal's angina in view of recurrent chest pain without any EKG abnormalities. 2.  Moderate obesity 3.  Chronic diastolic heart failure on low-dose beta-blocker therapy and Farxiga .  No clinical evidence of heart failure.  Recommendations: From cardiac standpoint I do not see any indications for patient's admission to the hospital,  she can be discharged home with outpatient follow-up.  Consider a trial of PPI.  Do not think asymptomatic gallstones are the etiology for her chest pain although this cannot be completely excluded.  Weight loss would certainly help with the overall wellbeing of the patient as well.  Patient also appears to be in extreme stress, consider therapy with SSRI if appropriate.  Will sign off, call if questions.   Gordy Bergamo, MD, [facility] 11/20/2024, 9:09 AM [facility] [address] [place], [place] [address] Phone: [phone]. Fax:  [phone]      [1]  Allergies Allergen Reactions   Alpha-D-Galactosidase Anaphylaxis, Hives, Palpitations, Dermatitis and Other (See Comments)    Other Reaction(s): Bronchospasm Can not eat any mammal Other reaction(s): Wheezing   Alpha-Gal Hives and Palpitations    Other reaction(s): Wheezing   Beef Allergy Anaphylaxis, Hives and Dermatitis    Alpha gal   Iodinated Contrast Media Hives, Shortness Of Breath, Itching and Dermatitis   Morphine  And Codeine Other (See Comments)    L leg (paralysis). Lasted 18-24 hours.      Penicillins Anaphylaxis, Hives, Shortness Of Breath, Itching, Dermatitis and Other (See Comments)    Other Reaction(s): Dizziness  Reaction: Chills (intolerance)    Other reaction(s): Chills (intolerance)    Did it involve swelling of the face/tongue/throat, SOB, or low BP? Yes Did it involve sudden or severe rash/hives, skin peeling, or any reaction on the inside of your mouth or nose? Yes Did you need to seek medical attention at a hospital or doctor's office? Yes When did it last happen?   Over 20 Years Ago    If all above answers are NO, may proceed with cephalosporin use.   Porcine (Pork) Protein-Containing Drug Products Anaphylaxis, Palpitations and Other (See Comments)    Other Reaction(s): Bronchospasm Can not eat any mammal Other reaction(s): Wheezing Itching, dermatitis, and Hives   Shellfish Allergy Anaphylaxis, Hives and Other (See Comments)    Swelling of face and throat, difficulty breathing   Thallium Hives and Other (See Comments)    Contrast dye for cardiac stress test.  Had increased heart rate, hives.   Thallous Chloride Tl 201 Hives, Shortness Of Breath and Dermatitis    Contrast dye for cardiac stress test.  Had increased heart rate, hives., Contrast dye for cardiac stress test.  Had increased heart rate, hives.,      Latex Itching, Dermatitis and Rash     Other reaction(s): Not available   Other Hives and Itching    Banana peppers   Sesame Oil Hives and Itching   Strawberry Extract Rash    Other reaction(s): Not available

## 2024-12-11 ENCOUNTER — Encounter: Payer: Self-pay | Admitting: Pharmacist Clinician (PhC)/ Clinical Pharmacy Specialist

## 2024-12-11 ENCOUNTER — Ambulatory Visit: Admitting: Pharmacist Clinician (PhC)/ Clinical Pharmacy Specialist

## 2024-12-11 DIAGNOSIS — E785 Hyperlipidemia, unspecified: Secondary | ICD-10-CM | POA: Insufficient documentation

## 2024-12-11 MED ORDER — ATORVASTATIN CALCIUM 80 MG PO TABS: 80.0000 mg | ORAL_TABLET | Freq: Every day | ORAL | 3 refills | Status: AC

## 2024-12-11 NOTE — Progress Notes (Signed)
 "  Office Visit    Patient Name: Julie Hampton Date of Encounter: 12/11/2024  Primary Care Provider:  Stephanie Charlene LITTIE, MD Primary Cardiologist:  Stanly DELENA Leavens, MD  Chief Complaint    Hyperlipidemia   Significant Past Medical History   CAD CAC = 11.3 (81st percentile)  CHF 6/24 EF at 45-50%, on metoprolol , dapagliflozin   HTN controlled  obesity As done V-shred diet with minimal benefit  Alpha gal Anaphylaxis with mammalian products     Allergies[1]  History of Present Illness    Julie Hampton is a 59 y.o. female patient of Dr Leavens, in the office today to discuss options for cholesterol management.  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  Pharmacy:   [facility]  LDL Cholesterol goal:  LDL < 70  Current Medications:  none - never taken any cholesterol medications  Family Hx:   father had high cholesterol, died at 28 from MI/CHF, mother died cancer at 43; 2 sisters no known cholesterol issues, son 61  has elevated cholesterol - monitoring  Social Hx: Tobacco: no Alcohol:  no  Diet:   combination, but out is local [employer] products, rarely adds salt; has alpha gal - all protein is chicken turkey limits carbs; has done V shred diet in 2025  Exercise: limited by R knee; but mobility good, 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, on her feet all day  Accessory Clinical Findings   07/21/23 (CareEverywhere) ;  TC 278, TG 202, HDL 54, LDL 185   No results found for: LIPOA  Lab Results  Component Value Date   ALT 18 11/19/2024   AST 23 11/19/2024   ALKPHOS 51 11/19/2024   BILITOT 0.8 11/19/2024   Lab Results  Component Value Date   CREATININE 0.79 11/20/2024   BUN 14 11/20/2024   NA 141 11/20/2024   K 3.8 11/20/2024   CL 106 11/20/2024   CO2 25 11/20/2024   No results found for: HGBA1C  Home Medications    Current Outpatient Medications  Medication Sig Dispense Refill   atorvastatin  (LIPITOR) 80 MG tablet Take 1 tablet (80  mg total) by mouth daily. 30 tablet 3   acetaminophen  (TYLENOL ) 500 MG tablet Take 500 mg by mouth every 6 (six) hours as needed for mild pain (pain score 1-3) or moderate pain (pain score 4-6).     albuterol  (VENTOLIN  HFA) 108 (90 Base) MCG/ACT inhaler Inhale 2 puffs into the lungs every 4 (four) hours as needed for wheezing or shortness of breath.     clotrimazole-betamethasone (LOTRISONE) cream Apply 1 Application topically 2 (two) times daily.     dapagliflozin  propanediol (FARXIGA ) 10 MG TABS tablet TAKE 1 TABLET BY MOUTH EVERY DAY BEFORE BREAKFAST 90 tablet 3   EPINEPHrine (EPIPEN 2-PAK) 0.3 mg/0.3 mL IJ SOAJ injection Inject 0.3 mg into the muscle as needed for anaphylaxis.     furosemide  (LASIX ) 20 MG tablet Take 1 tablet (20 mg total) by mouth daily as needed (leg swelling). (Patient taking differently: Take 20 mg by mouth daily.) 30 tablet 11   isosorbide  mononitrate (IMDUR ) 30 MG 24 hr tablet Take 1 tablet (30 mg total) by mouth daily. 30 tablet 0   levalbuterol (XOPENEX) 1.25 MG/3ML nebulizer solution Take 1.25 mg by nebulization every 6 (six) hours as needed for wheezing.     levETIRAcetam  (KEPPRA  XR) 500 MG 24 hr tablet Take 5 tablets (2,500 mg total) by mouth at bedtime. 450 tablet 3   lidocaine  (LIDODERM ) 5 %  Place 1 patch onto the skin daily. Remove & Discard patch within 12 hours or as directed by MD 30 patch 0   metoprolol  succinate (TOPROL -XL) 25 MG 24 hr tablet TAKE 1 TABLET (25 MG TOTAL) BY MOUTH DAILY. KEEP OV. 90 tablet 3   progesterone (PROMETRIUM) 200 MG capsule Take 200 mg by mouth at bedtime.     No current facility-administered medications for this visit.     Assessment & Plan    Hyperlipidemia LDL goal <70 Assessment: Patient with CAD, probable familial hyperlipidemia not at LDL goal of < 70 Most recent LDL 185 on 07/20/24 Has never taken cholesterol lowering medications Reviewed options for lowering LDL cholesterol, including statins and ezetimibe.  Discussed  mechanisms of action, dosing, side effects, potential decreases in LDL cholesterol and costs.  Also reviewed potential options for patient assistance.  (Husband on atorvastatin  80/ezetimibe 10)  Plan: Patient agreeable to starting atorvastatin  80 mg daily Repeat labs after: 2 months  Lipid Liver function    Allean Mink, PharmD CPP [facility] [address]   [place], [place] [address] [phone]  12/11/2024, 10:26 AM       [1]  Allergies Allergen Reactions   Alpha-D-Galactosidase Anaphylaxis, Hives, Palpitations, Dermatitis and Other (See Comments)    Other Reaction(s): Bronchospasm Can not eat any mammal Other reaction(s): Wheezing   Alpha-Gal Hives and Palpitations    Other reaction(s): Wheezing   Beef Allergy Anaphylaxis, Hives and Dermatitis    Alpha gal   Iodinated Contrast Media Hives, Shortness Of Breath, Itching and Dermatitis   Morphine  And Codeine Other (See Comments)    L leg (paralysis). Lasted 18-24 hours.      Penicillins Anaphylaxis, Hives, Shortness Of Breath, Itching, Dermatitis and Other (See Comments)    Other Reaction(s): Dizziness  Reaction: Chills (intolerance)    Other reaction(s): Chills (intolerance)    Did it involve swelling of the face/tongue/throat, SOB, or low BP? Yes Did it involve sudden or severe rash/hives, skin peeling, or any reaction on the inside of your mouth or nose? Yes Did you need to seek medical attention at a hospital or doctor's office? Yes When did it last happen?   Over 20 Years Ago    If all above answers are NO, may proceed with cephalosporin use.   Porcine (Pork) Protein-Containing Drug Products Anaphylaxis, Palpitations and Other (See Comments)    Other Reaction(s): Bronchospasm Can not eat any mammal Other reaction(s): Wheezing Itching, dermatitis, and Hives   Shellfish Allergy Anaphylaxis, Hives and Other (See Comments)    Swelling of face and throat, difficulty breathing   Thallium Hives and Other (See Comments)     Contrast dye for cardiac stress test.  Had increased heart rate, hives.   Thallous Chloride Tl 201 Hives, Shortness Of Breath and Dermatitis    Contrast dye for cardiac stress test.  Had increased heart rate, hives., Contrast dye for cardiac stress test.  Had increased heart rate, hives.,      Latex Itching, Dermatitis and Rash    Other reaction(s): Not available   Other Hives and Itching    Banana peppers   Sesame Oil Hives and Itching   Strawberry Extract Rash    Other reaction(s): Not available   "

## 2024-12-11 NOTE — Assessment & Plan Note (Signed)
 Assessment: Patient with CAD, probable familial hyperlipidemia not at LDL goal of < 70 Most recent LDL 185 on 07/20/24 Has never taken cholesterol lowering medications Reviewed options for lowering LDL cholesterol, including statins and ezetimibe.  Discussed mechanisms of action, dosing, side effects, potential decreases in LDL cholesterol and costs.  Also reviewed potential options for patient assistance.  (Husband on atorvastatin  80/ezetimibe 10)  Plan: Patient agreeable to starting atorvastatin  80 mg daily Repeat labs after: 2 months  Lipid Liver function

## 2024-12-11 NOTE — Patient Instructions (Signed)
 Your Results:             Your most recent labs Goal  Total Cholesterol 278 < 200  Triglycerides 202 < 150  HDL (happy/good cholesterol) 54 > 40  LDL (lousy/bad cholesterol 185 < 185   Medication changes:  Start atorvastatin  80 mg once daily.    Lab orders:  We want to repeat labs after 2-3 months.  We will send you a lab order to remind you once we get closer to that time.    If you have any questions or concerns, please reach out to me in MyChart   Thank you for choosing [facility]

## 2025-01-25 ENCOUNTER — Telehealth: Payer: 59 | Admitting: Family Medicine
